# Patient Record
Sex: Female | Born: 1970 | Race: Black or African American | Hispanic: No | Marital: Married | Smoking: Never smoker
Health system: Southern US, Community
[De-identification: ages and names within clinical notes are randomized; demographics above are authoritative.]

## PROBLEM LIST (undated history)

## (undated) DIAGNOSIS — K219 Gastro-esophageal reflux disease without esophagitis: Secondary | ICD-10-CM

## (undated) DIAGNOSIS — K625 Hemorrhage of anus and rectum: Secondary | ICD-10-CM

## (undated) DIAGNOSIS — I10 Essential (primary) hypertension: Secondary | ICD-10-CM

## (undated) DIAGNOSIS — D649 Anemia, unspecified: Secondary | ICD-10-CM

## (undated) DIAGNOSIS — Z5189 Encounter for other specified aftercare: Secondary | ICD-10-CM

## (undated) HISTORY — DX: Essential (primary) hypertension: I10

## (undated) HISTORY — DX: Encounter for other specified aftercare: Z51.89

---

## 2005-08-03 ENCOUNTER — Ambulatory Visit (HOSPITAL_COMMUNITY): Admission: RE | Admit: 2005-08-03 | Discharge: 2005-08-03 | Payer: Self-pay | Admitting: General Surgery

## 2012-10-03 ENCOUNTER — Other Ambulatory Visit: Payer: Self-pay | Admitting: Family Medicine

## 2012-10-03 DIAGNOSIS — Z1231 Encounter for screening mammogram for malignant neoplasm of breast: Secondary | ICD-10-CM

## 2012-10-19 ENCOUNTER — Encounter (INDEPENDENT_AMBULATORY_CARE_PROVIDER_SITE_OTHER): Payer: Self-pay | Admitting: *Deleted

## 2012-10-26 ENCOUNTER — Ambulatory Visit: Payer: Self-pay

## 2012-11-10 ENCOUNTER — Ambulatory Visit
Admission: RE | Admit: 2012-11-10 | Discharge: 2012-11-10 | Disposition: A | Discharge status: Home / Self Care | Payer: BC Managed Care – PPO | Source: Ambulatory Visit | Attending: Family Medicine | Admitting: Family Medicine

## 2012-11-10 DIAGNOSIS — Z1231 Encounter for screening mammogram for malignant neoplasm of breast: Secondary | ICD-10-CM

## 2012-11-20 ENCOUNTER — Telehealth (INDEPENDENT_AMBULATORY_CARE_PROVIDER_SITE_OTHER): Payer: Self-pay | Admitting: *Deleted

## 2012-11-20 ENCOUNTER — Other Ambulatory Visit (INDEPENDENT_AMBULATORY_CARE_PROVIDER_SITE_OTHER): Payer: Self-pay | Admitting: *Deleted

## 2012-11-20 ENCOUNTER — Ambulatory Visit (INDEPENDENT_AMBULATORY_CARE_PROVIDER_SITE_OTHER): Payer: BC Managed Care – PPO | Admitting: Internal Medicine

## 2012-11-20 ENCOUNTER — Encounter (INDEPENDENT_AMBULATORY_CARE_PROVIDER_SITE_OTHER): Payer: Self-pay | Admitting: Internal Medicine

## 2012-11-20 VITALS — BP 130/80 | HR 80 | Temp 98.3°F | Ht 60.0 in | Wt 135.4 lb

## 2012-11-20 DIAGNOSIS — I10 Essential (primary) hypertension: Secondary | ICD-10-CM | POA: Insufficient documentation

## 2012-11-20 DIAGNOSIS — Z1211 Encounter for screening for malignant neoplasm of colon: Secondary | ICD-10-CM

## 2012-11-20 DIAGNOSIS — K625 Hemorrhage of anus and rectum: Secondary | ICD-10-CM

## 2012-11-20 MED ORDER — PEG 3350-KCL-NA BICARB-NACL 420 G PO SOLR: 4000.0000 mL | Freq: Once | ORAL | Status: DC

## 2012-11-20 NOTE — Telephone Encounter (Signed)
Patient needs trilyte instructions 

## 2012-11-20 NOTE — Progress Notes (Signed)
Subjective:     Patient ID: Christine Kidd, female   DOB: March 14, 1970, 42 y.o.   MRN: [id number]  HPIReferred to our office by Dr. Theodis Shove for blood in stools and frequent diarrhea. Fecal occult blood in office was negative. She says she occasionally sees blood. She has been seeing blood off and on for about 2 yrs. Sometimes the blood is bright red and sometimes it is stringy dark. It occurs about every 2 days and usually last to about 1 week to 10 days. No pain associated with the rectal bleeding. She is not straining to have a BM. Stools are formed and she says they are smaller and more frequent. She is having 2-3 stools a day. She was having one stool a day. Appetite is not good. Her appetite has decreased. She thinks she may have lost about 4-5 pounds over the past 3 months.  Sometimes she will have soreness in her epigastric region and rt lower quadrant. It feels like she has done sit ups when they occur. She has frequent burping. No acid reflux.  NoNSAIDs except for the daily ASA 81mg .    09/23/2012 H and H 13.9 and 41.2   Review of Systems  See hpi Current Outpatient Prescriptions  Medication Sig Dispense Refill  . aspirin 81 MG tablet Take 81 mg by mouth daily.      Marland Kitchen triamterene-hydrochlorothiazide (DYAZIDE) 37.5-25 MG per capsule Take 1 capsule by mouth daily.       No current facility-administered medications for this visit.   Past Medical History  Diagnosis Date  . Hypertension     x 14 yrs   History reviewed. No pertinent past surgical history. No Known Allergies      Objective:   Physical Exam  Filed Vitals:   11/20/12 1513  BP: 130/80  Pulse: 80  Temp: 98.3 F (36.8 C)  Height: 5' (1.524 m)  Weight: 135 lb 6.4 oz (61.417 kg)   Alert and oriented. Skin warm and dry. Oral mucosa is moist.   . Sclera anicteric, conjunctivae is pink. Thyroid not enlarged. No cervical lymphadenopathy. Lungs clear. Heart regular rate and rhythm.  Abdomen is soft. Bowel  sounds are positive. No hepatomegaly. No abdominal masses felt. No tenderness.  No edema to lower extremities. Patient is alert and oriented.     Assessment:    Rectal bleeding. Colonic neoplasm needs to be ruled. Diverticular bleed, polyp and hemorroids are in the differential.     Plan:    Colonoscopy with Dr. Karilyn Cota.The risks and benefits such as perforation, bleeding, and infection were reviewed with the patient and is agreeable.

## 2012-11-20 NOTE — Patient Instructions (Signed)
The risks and benefits such as perforation, bleeding, and infection were reviewed with the patient and is agreeable. 

## 2012-11-29 ENCOUNTER — Encounter (HOSPITAL_COMMUNITY): Payer: Self-pay | Admitting: Pharmacy Technician

## 2012-11-30 ENCOUNTER — Encounter (HOSPITAL_COMMUNITY): Payer: Self-pay | Admitting: *Deleted

## 2012-11-30 ENCOUNTER — Ambulatory Visit (HOSPITAL_COMMUNITY)
Admission: RE | Admit: 2012-11-30 | Discharge: 2012-11-30 | Disposition: A | Discharge status: Home / Self Care | Payer: BC Managed Care – PPO | Source: Ambulatory Visit | Attending: Internal Medicine | Admitting: Internal Medicine

## 2012-11-30 ENCOUNTER — Encounter (HOSPITAL_COMMUNITY)
Admission: RE | Disposition: A | Discharge status: Home / Self Care | Payer: Self-pay | Source: Ambulatory Visit | Attending: Internal Medicine

## 2012-11-30 DIAGNOSIS — D179 Benign lipomatous neoplasm, unspecified: Secondary | ICD-10-CM | POA: Insufficient documentation

## 2012-11-30 DIAGNOSIS — K5730 Diverticulosis of large intestine without perforation or abscess without bleeding: Secondary | ICD-10-CM

## 2012-11-30 DIAGNOSIS — K644 Residual hemorrhoidal skin tags: Secondary | ICD-10-CM | POA: Insufficient documentation

## 2012-11-30 DIAGNOSIS — K648 Other hemorrhoids: Secondary | ICD-10-CM

## 2012-11-30 DIAGNOSIS — R197 Diarrhea, unspecified: Secondary | ICD-10-CM | POA: Insufficient documentation

## 2012-11-30 DIAGNOSIS — I10 Essential (primary) hypertension: Secondary | ICD-10-CM | POA: Insufficient documentation

## 2012-11-30 DIAGNOSIS — K626 Ulcer of anus and rectum: Secondary | ICD-10-CM

## 2012-11-30 DIAGNOSIS — K625 Hemorrhage of anus and rectum: Secondary | ICD-10-CM | POA: Insufficient documentation

## 2012-11-30 HISTORY — DX: Hemorrhage of anus and rectum: K62.5

## 2012-11-30 HISTORY — PX: COLONOSCOPY: SHX5424

## 2012-11-30 SURGERY — COLONOSCOPY
Anesthesia: Moderate Sedation

## 2012-11-30 MED ORDER — SODIUM CHLORIDE 0.9 % IV SOLN
INTRAVENOUS | Status: DC
  Administered 2012-11-30: 08:00:00 via INTRAVENOUS

## 2012-11-30 MED ORDER — MEPERIDINE HCL 50 MG/ML IJ SOLN
INTRAMUSCULAR | Status: DC | PRN
  Administered 2012-11-30 (×2): 25 mg via INTRAVENOUS

## 2012-11-30 MED ORDER — MIDAZOLAM HCL 5 MG/5ML IJ SOLN
INTRAMUSCULAR | Status: AC
  Filled 2012-11-30: qty 10

## 2012-11-30 MED ORDER — DICYCLOMINE HCL 10 MG PO CAPS: 10.0000 mg | ORAL_CAPSULE | Freq: Two times a day (BID) | ORAL | Status: DC

## 2012-11-30 MED ORDER — MIDAZOLAM HCL 5 MG/5ML IJ SOLN
INTRAMUSCULAR | Status: DC | PRN
  Administered 2012-11-30 (×6): 2 mg via INTRAVENOUS

## 2012-11-30 MED ORDER — MEPERIDINE HCL 50 MG/ML IJ SOLN
INTRAMUSCULAR | Status: AC
  Filled 2012-11-30: qty 1

## 2012-11-30 MED ORDER — MIDAZOLAM HCL 5 MG/5ML IJ SOLN
INTRAMUSCULAR | Status: AC
  Filled 2012-11-30: qty 5

## 2012-11-30 MED ORDER — HYDROCORTISONE ACETATE 25 MG RE SUPP: 25.0000 mg | Freq: Every day | RECTAL | Status: DC

## 2012-11-30 NOTE — Op Note (Addendum)
COLONOSCOPY PROCEDURE REPORT  PATIENT:  Christine Kidd  MR#:  [id number] Birthdate:  [birth year], 42 y.o., female Endoscopist:  Dr. Malissa Hippo, MD Referred By:  Dr. Trinna Post, M.D.  Procedure Date: 11/30/2012  Procedure:   Colonoscopy  Indications:  Patient is 41 year old African female who presents with two-year history of intermittent diarrhea and rectal bleeding.  Informed Consent:  The procedure and risks were reviewed with the patient and informed consent was obtained.  Medications:  Demerol 50 mg IV Versed 12 mg IV  Description of procedure:  After a digital rectal exam was performed, that colonoscope was advanced from the anus through the rectum and colon to the area of the cecum, ileocecal valve and appendiceal orifice. The cecum was deeply intubated. These structures were well-seen and photographed for the record. From the level of the cecum and ileocecal valve, the scope was slowly and cautiously withdrawn. The mucosal surfaces were carefully surveyed utilizing scope tip to flexion to facilitate fold flattening as needed. The scope was pulled down into the rectum where a thorough exam including retroflexion was performed.  Findings:   Prep excellent. 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 Single small diverticulum at hepatic flexure. Redundant and tortuous sigmoid colon. 1 cm diameter distal rectal ulcer just above the dentate line. Small hemorrhoids above the dentate line and prominent hemorrhoids below the dentate line.   Therapeutic/Diagnostic Maneuvers Performed:  Biopsy taken from distal rectal ulcer.  Complications:  None  Cecal Withdrawal Time:  15 minutes  Impression:  Examination performed to cecum. Single small submucosal lipoma ascending colon. Single small diverticulum at hepatic flexure. Distal rectal ulcer proximal to dentate line. I see taken for routine histology. Internal/external hemorrhoids.  Suspect she has diarrhea  secondary to IBS and rectal bleeding secondary to distal rectal ulcer which endoscopically appears to be benign.  Recommendations:  Standard instructions given. Metamucil 4 g by mouth each bedtime. Dicyclomine 10 mg by mouth twice a day. Anusol-HC suppository 1 per rectum daily at bedtime for 2 weeks. I will contact patient with biopsy results and further recommendations.  REHMAN,NAJEEB U  11/30/2012 9:20 AM  CC: Dr. Hassan Rowan, Su Monks, MD & Dr. Bonnetta Barry ref. provider found

## 2012-11-30 NOTE — H&P (Signed)
Christine Kidd is an 42 y.o. female.   Chief Complaint: Patient is here for colonoscopy. HPI: Patient is a 42 year old African female who presents with 2 years history of diarrhea and rectal bleeding. Diarrhea is intermittent and may occur at least 10 days each month. She generally has 3-6 stools when she has diarrhea. The rest of the days she is normal in occasion she may be constipated and takes a laxative. She denies nocturnal bowel movements. She complains of intermittent stabbing pain in right lower quadrant. Lately she's not had a good appetite and has lost 7 pounds. She passes a small to moderate amount of bright red or dark blood intermittently but not daily. Her H&H on 09/23/2012 was 13.9 and 41.2. Family history is negative for CRC for inflammatory bowel disease.  Past Medical History  Diagnosis Date  . Hypertension     x 14 yrs  . Rectal bleeding     Past Surgical History  Procedure Laterality Date  . No past surgeries      Family History  Problem Relation Age of Onset  . Colon cancer Neg Hx    Social History:  reports that she has never smoked. She does not have any smokeless tobacco history on file. She reports that she does not drink alcohol or use illicit drugs.  Allergies: No Known Allergies  Medications Prior to Admission  Medication Sig Dispense Refill  . aspirin 81 MG tablet Take 81 mg by mouth daily.      Marland Kitchen triamterene-hydrochlorothiazide (DYAZIDE) 37.5-25 MG per capsule Take 1 capsule by mouth daily.        No results found for this or any previous visit (from the past 48 hour(s)). No results found.  ROS  Blood pressure 134/92, pulse 100, temperature 98.3 F (36.8 C), temperature source Oral, resp. rate 22, height 5\' 3"  (1.6 m), weight 130 lb (58.968 kg), last menstrual period 11/13/2012, SpO2 100.00%. Physical Exam  Constitutional: She appears well-developed and well-nourished.  HENT:  Mouth/Throat: Oropharynx is clear and moist.  Eyes:  Conjunctivae are normal. No scleral icterus.  Neck: No thyromegaly present.  Cardiovascular: Normal rate, regular rhythm and normal heart sounds.   No murmur heard. Respiratory: Effort normal and breath sounds normal.  GI: Soft. She exhibits no distension and no mass. There is no tenderness.  Musculoskeletal: She exhibits no edema.  Lymphadenopathy:    She has no cervical adenopathy.  Neurological: She is alert.  Skin: Skin is warm and dry.     Assessment/Plan Recurrent diarrhea and hematochezia. Diagnostic colonoscopy.  REHMAN,NAJEEB U 11/30/2012, 8:26 AM

## 2012-12-06 ENCOUNTER — Encounter (HOSPITAL_COMMUNITY): Payer: Self-pay | Admitting: Internal Medicine

## 2012-12-06 ENCOUNTER — Encounter (INDEPENDENT_AMBULATORY_CARE_PROVIDER_SITE_OTHER): Payer: Self-pay | Admitting: *Deleted

## 2012-12-06 NOTE — Progress Notes (Signed)
Apt has been scheduled for 01/17/12 with Dorene Ar, NP.

## 2012-12-21 ENCOUNTER — Encounter (INDEPENDENT_AMBULATORY_CARE_PROVIDER_SITE_OTHER): Payer: Self-pay

## 2013-01-16 ENCOUNTER — Ambulatory Visit (INDEPENDENT_AMBULATORY_CARE_PROVIDER_SITE_OTHER): Payer: BC Managed Care – PPO | Admitting: Internal Medicine

## 2013-01-16 ENCOUNTER — Encounter (INDEPENDENT_AMBULATORY_CARE_PROVIDER_SITE_OTHER): Payer: Self-pay | Admitting: Internal Medicine

## 2013-01-16 VITALS — BP 126/60 | HR 70 | Temp 99.0°F | Ht 63.0 in | Wt 135.6 lb

## 2013-01-16 DIAGNOSIS — K219 Gastro-esophageal reflux disease without esophagitis: Secondary | ICD-10-CM

## 2013-01-16 MED ORDER — OMEPRAZOLE 40 MG PO CPDR: 40.0000 mg | DELAYED_RELEASE_CAPSULE | Freq: Every day | ORAL | Status: DC

## 2013-01-16 NOTE — Patient Instructions (Signed)
Will try her on Omeprazole 40mg  daily 30 minutes before breakfast and see how she does. OV in 6 weeks.

## 2013-01-16 NOTE — Progress Notes (Signed)
Subjective:     Patient ID: Christine Kidd, female   DOB: [birth year], 43 y.o.   MRN: [id number]  HPI Here today for f/u after recently undergoing a colonoscopy in November for rectal bleeding and diarrhea. Please seen below.  She tells me she is doing better. The bleeding is better. She occasionally see  Blood when she wipes. No melena.  She is having a BM x 2 a day and are formed.  Today, she c/o that she is having problem with dinner. She will eat about 1/2 her meal and have to stop. There has been no weight loss.  She does c/o burning in her throat. She is not taking a PPI.  Burning occurs if she has eaten or not eaten.  No NSAIDs except the ASA 81mg .   11/30/2012 Colonoscopy: Rectal bleeding and diarrhea:  Impression:  Examination performed to cecum.  Single small submucosal lipoma ascending colon.  Single small diverticulum at hepatic flexure.  Distal rectal ulcer proximal to dentate line. I see taken for routine histology.  Internal/external hemorrhoids. Biopsy:  Biopsy reveals ulcerated mucosa at the squamocolumnar junction. Suspect these changes are secondary to hemorrhoids.  Standard instructions given.  Metamucil 4 g by mouth each bedtime.  Dicyclomine 10 mg by mouth twice a day.  Anusol-HC suppository 1 per rectum daily at bedtime for 2 weeks.  I will contact patient with biopsy results and further recommendations.     Review of Systems Current Outpatient Prescriptions  Medication Sig Dispense Refill  . aspirin 81 MG tablet Take 81 mg by mouth daily.      Marland Kitchen dicyclomine (BENTYL) 10 MG capsule Take 1 capsule (10 mg total) by mouth 2 (two) times daily before a meal.  60 capsule  5  . triamterene-hydrochlorothiazide (DYAZIDE) 37.5-25 MG per capsule Take 1 capsule by mouth daily.      . Wheat Dextrin (BENEFIBER DRINK MIX PO) Take by mouth.       No current facility-administered medications for this visit.   Past Medical History  Diagnosis Date  . Hypertension     x  14 yrs  . Rectal bleeding    Past Surgical History  Procedure Laterality Date  . No past surgeries    . Colonoscopy N/A 11/30/2012    Procedure: COLONOSCOPY;  Surgeon: Rogene Houston, MD;  Location: [facility];  Service: Endoscopy;  Laterality: N/A;  830   No Known Allergies      Objective:   Physical Exam  Filed Vitals:   01/16/13 1542  BP: 126/60  Pulse: 70  Temp: 99 F (37.2 C)  Height: 5\' 3"  (1.6 m)  Weight: 135 lb 9.6 oz (61.508 kg)   Alert and oriented. Skin warm and dry. Oral mucosa is moist.   . Sclera anicteric, conjunctivae is pink. Thyroid not enlarged. No cervical lymphadenopathy. Lungs clear. Heart regular rate and rhythm.  Abdomen is soft. Bowel sounds are positive. No hepatomegaly. No abdominal masses felt. No tenderness.  No edema to lower extremities.       Assessment:   GERD. Not on a PPI at this time. Diarrhea has resolved now with the Metamucil.     Plan:    Start Omeprazole 40mg  30 minutes before breakfast. OV in 6 weeks. Continue the Metamucil

## 2013-02-27 ENCOUNTER — Ambulatory Visit (INDEPENDENT_AMBULATORY_CARE_PROVIDER_SITE_OTHER): Payer: BC Managed Care – PPO | Admitting: Internal Medicine

## 2013-04-03 ENCOUNTER — Ambulatory Visit (INDEPENDENT_AMBULATORY_CARE_PROVIDER_SITE_OTHER): Payer: BC Managed Care – PPO | Admitting: Internal Medicine

## 2013-04-03 ENCOUNTER — Encounter (INDEPENDENT_AMBULATORY_CARE_PROVIDER_SITE_OTHER): Payer: Self-pay | Admitting: Internal Medicine

## 2013-04-03 VITALS — BP 112/66 | HR 84 | Ht 63.0 in | Wt 136.1 lb

## 2013-04-03 DIAGNOSIS — K589 Irritable bowel syndrome without diarrhea: Secondary | ICD-10-CM | POA: Insufficient documentation

## 2013-04-03 DIAGNOSIS — K6289 Other specified diseases of anus and rectum: Secondary | ICD-10-CM

## 2013-04-03 DIAGNOSIS — K219 Gastro-esophageal reflux disease without esophagitis: Secondary | ICD-10-CM

## 2013-04-03 MED ORDER — HYDROCORTISONE ACETATE 25 MG RE SUPP: 25.0000 mg | Freq: Two times a day (BID) | RECTAL | Status: DC

## 2013-04-03 MED ORDER — DICYCLOMINE HCL 10 MG PO CAPS: 10.0000 mg | ORAL_CAPSULE | Freq: Three times a day (TID) | ORAL | Status: DC

## 2013-04-03 NOTE — Progress Notes (Signed)
Subjective:     Patient ID: Christine Kidd, female   DOB: 29-Sep-1970, 43 y.o.   MRN: [id number]  HPI Here today for f/u. She underwent a colonoscopy in November for rectal bleeding and diarrhea. Please see below.  She tells me she has burning in her esophagus. Her insurance cut her Omeprazole to 20mg  instead of 40mg .  She still has urge to go to the bathroom, but then does not have a BM . She has a BM usually 4 a day. He stools are formed.  No melena or bright red rectal bleeding No NSAIDs except for the ASA 81mg . Appetite is okay. No weight loss. Occasionally has epigastric tenderness and tenderness at umbilicus.  She is on a Probiotic ([place]). She also eats Activa yoguart     11/30/2012 Colonoscopy: Rectal bleeding and diarrhea:  Impression:  Examination performed to cecum.  Single small submucosal lipoma ascending colon.  Single small diverticulum at hepatic flexure.  Distal rectal ulcer proximal to dentate line. I see taken for routine histology.  Internal/external hemorrhoids.  Biopsy:  Biopsy reveals ulcerated mucosa at the squamocolumnar junction. Suspect these changes are secondary to hemorrhoids.  Standard instructions given.  Metamucil 4 g by mouth each bedtime.  Dicyclomine 10 mg by mouth twice a day.  Anusol-HC suppository 1 per rectum daily at bedtime for 2 weeks.  I will contact patient with biopsy results and further recommendations.   Review of Systems Past Medical History  Diagnosis Date  . Hypertension     x 14 yrs  . Rectal bleeding     Past Surgical History  Procedure Laterality Date  . No past surgeries    . Colonoscopy N/A 11/30/2012    Procedure: COLONOSCOPY;  Surgeon: Rogene Houston, MD;  Location: [facility];  Service: Endoscopy;  Laterality: N/A;  830    No Known Allergies  Current Outpatient Prescriptions on File Prior to Visit  Medication Sig Dispense Refill  . aspirin 81 MG tablet Take 81 mg by mouth daily.      Marland Kitchen  dicyclomine (BENTYL) 10 MG capsule Take 1 capsule (10 mg total) by mouth 2 (two) times daily before a meal.  60 capsule  5  . omeprazole (PRILOSEC) 40 MG capsule Take 1 capsule (40 mg total) by mouth daily.  90 capsule  3  . triamterene-hydrochlorothiazide (DYAZIDE) 37.5-25 MG per capsule Take 1 capsule by mouth daily.      . Wheat Dextrin (BENEFIBER DRINK MIX PO) Take by mouth.       No current facility-administered medications on file prior to visit.   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 Two children in good health.      Objective:   Physical Exam  Filed Vitals:   04/03/13 1501  BP: 112/66  Pulse: 84  Height: 5\' 3"  (1.6 m)  Weight: 136 lb 1.6 oz (61.735 kg)   Alert and oriented. Skin warm and dry. Oral mucosa is moist.   . Sclera anicteric, conjunctivae is pink. Thyroid not enlarged. No cervical lymphadenopathy. Lungs clear. Heart regular rate and rhythm.  Abdomen is soft. Bowel sounds are positive. No hepatomegaly. No abdominal masses felt. Slight lower abdominal  tenderness.         Assessment:    GERD, Presently taking Omeprazole.  Rectal pain probably from Hemorrhoids. Her stools are formed now.  IBS: Presently taking Dicyclomine 10mg  and will increase to TID.    Plan:     Dicyclomine 10mg  TID, Anusol supp BID x 6 days  Continue the Omeprazole. OV in 1 yr. If any problems, call our office

## 2013-04-03 NOTE — Patient Instructions (Signed)
OV 1 yr. Dicyclomine 10mg  TID, Anusol Supp BID. Prilosec 40mg  daily.

## 2013-04-03 NOTE — Progress Notes (Deleted)
Subjective:     Patient ID: Christine Kidd, female   DOB: 1970/10/03, 43 y.o.   MRN: [id number]  HPI   Review of Systems     Objective:   Physical Exam     Assessment:     ***    Plan:     ***

## 2013-07-30 ENCOUNTER — Other Ambulatory Visit (HOSPITAL_COMMUNITY): Payer: Self-pay | Admitting: Internal Medicine

## 2013-07-31 ENCOUNTER — Other Ambulatory Visit (INDEPENDENT_AMBULATORY_CARE_PROVIDER_SITE_OTHER): Payer: Self-pay | Admitting: Internal Medicine

## 2013-07-31 ENCOUNTER — Telehealth (INDEPENDENT_AMBULATORY_CARE_PROVIDER_SITE_OTHER): Payer: Self-pay | Admitting: Internal Medicine

## 2013-07-31 DIAGNOSIS — K589 Irritable bowel syndrome without diarrhea: Secondary | ICD-10-CM

## 2013-07-31 MED ORDER — DICYCLOMINE HCL 10 MG PO CAPS: 10.0000 mg | ORAL_CAPSULE | Freq: Three times a day (TID) | ORAL | Status: DC

## 2013-07-31 NOTE — Telephone Encounter (Signed)
Dicyclomine 10mg  twice a day.

## 2013-10-08 ENCOUNTER — Encounter (INDEPENDENT_AMBULATORY_CARE_PROVIDER_SITE_OTHER): Payer: Self-pay | Admitting: Internal Medicine

## 2013-10-08 ENCOUNTER — Ambulatory Visit (INDEPENDENT_AMBULATORY_CARE_PROVIDER_SITE_OTHER): Payer: BC Managed Care – PPO | Admitting: Internal Medicine

## 2013-10-08 VITALS — BP 118/74 | HR 80 | Temp 98.3°F | Ht 63.0 in | Wt 132.0 lb

## 2013-10-08 DIAGNOSIS — A084 Viral intestinal infection, unspecified: Secondary | ICD-10-CM

## 2013-10-08 DIAGNOSIS — K529 Noninfective gastroenteritis and colitis, unspecified: Secondary | ICD-10-CM

## 2013-10-08 DIAGNOSIS — A088 Other specified intestinal infections: Secondary | ICD-10-CM

## 2013-10-08 DIAGNOSIS — K5289 Other specified noninfective gastroenteritis and colitis: Secondary | ICD-10-CM

## 2013-10-08 NOTE — Patient Instructions (Signed)
CBC, and CMET today.  Force fluids.

## 2013-10-08 NOTE — Progress Notes (Signed)
   Subjective:    Patient ID: Christine Kidd, female    DOB: 1970-02-01, 43 y.o.   MRN: [id number]  HPI Here today with c/o of diarrhea since Friday. She says she has no appetite since Friday. Started Friday morning. On Friday she had chills and had a fever. She had 8 loose stools Friday. Saturday she had 5 stools and Sunday she had 4 stools. Today she has had 3 stools and are watery. She has tried Imodium. Her fever broke last night. She has not had an appetite. She has nausea. She says she laid around all weekend. Today she feels better. She feels 40% better. No recent antibiotics. Normally she has 2-3 stools and are formed with the Dicyclomine.     11 /20/2014 Colonoscopy:  Impression:  Examination performed to cecum.  Single small submucosal lipoma ascending colon.  Single small diverticulum at hepatic flexure.  Distal rectal ulcer proximal to dentate line. I see taken for routine histology.  Internal/external hemorrhoids.  Suspect she has diarrhea secondary to IBS and rectal bleeding secondary to distal rectal ulcer which endoscopically appears to be benign.       Review of Systems Past Medical History  Diagnosis Date  . Hypertension     x 14 yrs  . Rectal bleeding     Past Surgical History  Procedure Laterality Date  . No past surgeries    . Colonoscopy N/A 11/30/2012    Procedure: COLONOSCOPY;  Surgeon: Rogene Houston, MD;  Location: [facility];  Service: Endoscopy;  Laterality: N/A;  830    No Known Allergies  Current Outpatient Prescriptions on File Prior to Visit  Medication Sig Dispense Refill  . aspirin 81 MG tablet Take 81 mg by mouth daily.      Marland Kitchen dicyclomine (BENTYL) 10 MG capsule Take 1 capsule (10 mg total) by mouth 3 (three) times daily.  90 capsule  4  . omeprazole (PRILOSEC) 40 MG capsule Take 1 capsule (40 mg total) by mouth daily.  90 capsule  3  . triamterene-hydrochlorothiazide (DYAZIDE) 37.5-25 MG per capsule Take 1 capsule by mouth  daily.      . Wheat Dextrin (BENEFIBER DRINK MIX PO) Take by mouth.       No current facility-administered medications on file prior to visit.        Objective:   Physical Exam  Filed Vitals:   10/08/13 1553  BP: 118/74  Pulse: 80  Temp: 98.3 F (36.8 C)  Height: 5\' 3"  (1.6 m)  Weight: 132 lb (59.875 kg)   Alert and oriented. Skin warm and dry. Oral mucosa is moist.   . Sclera anicteric, conjunctivae is pink. Thyroid not enlarged. No cervical lymphadenopathy. Lungs clear. Heart regular rate and rhythm.  Abdomen is soft. Bowel sounds are positive. No hepatomegaly. No abdominal masses felt. No tenderness.  No edema to lower extremities.         Assessment & Plan:  Viral gatroenteritis given her hx.  Stools are gradually slowing down.  Gatorade. Force fluid. Bland diet. CBC, CMET today.

## 2013-10-09 LAB — COMPREHENSIVE METABOLIC PANEL
ALK PHOS: 41 U/L (ref 39–117)
ALT: 26 U/L (ref 0–35)
AST: 27 U/L (ref 0–37)
Albumin: 5 g/dL (ref 3.5–5.2)
BUN: 16 mg/dL (ref 6–23)
CO2: 26 mEq/L (ref 19–32)
Calcium: 10.2 mg/dL (ref 8.4–10.5)
Chloride: 97 mEq/L (ref 96–112)
Creat: 1.05 mg/dL (ref 0.50–1.10)
Glucose, Bld: 77 mg/dL (ref 70–99)
POTASSIUM: 4.5 meq/L (ref 3.5–5.3)
SODIUM: 135 meq/L (ref 135–145)
Total Bilirubin: 0.6 mg/dL (ref 0.2–1.2)
Total Protein: 8 g/dL (ref 6.0–8.3)

## 2013-10-09 LAB — CBC WITH DIFFERENTIAL/PLATELET
BASOS ABS: 0.1 10*3/uL (ref 0.0–0.1)
BASOS PCT: 1 % (ref 0–1)
Eosinophils Absolute: 0.1 10*3/uL (ref 0.0–0.7)
Eosinophils Relative: 1 % (ref 0–5)
HCT: 36 % (ref 36.0–46.0)
Hemoglobin: 11.3 g/dL — ABNORMAL LOW (ref 12.0–15.0)
LYMPHS PCT: 23 % (ref 12–46)
Lymphs Abs: 1.8 10*3/uL (ref 0.7–4.0)
MCH: 23.9 pg — ABNORMAL LOW (ref 26.0–34.0)
MCHC: 31.4 g/dL (ref 30.0–36.0)
MCV: 76.1 fL — ABNORMAL LOW (ref 78.0–100.0)
MONO ABS: 0.6 10*3/uL (ref 0.1–1.0)
Monocytes Relative: 8 % (ref 3–12)
NEUTROS ABS: 5.2 10*3/uL (ref 1.7–7.7)
NEUTROS PCT: 67 % (ref 43–77)
Platelets: 468 10*3/uL — ABNORMAL HIGH (ref 150–400)
RBC: 4.73 MIL/uL (ref 3.87–5.11)
RDW: 15.8 % — AB (ref 11.5–15.5)
WBC: 7.8 10*3/uL (ref 4.0–10.5)

## 2013-10-11 ENCOUNTER — Telehealth (INDEPENDENT_AMBULATORY_CARE_PROVIDER_SITE_OTHER): Payer: Self-pay | Admitting: Internal Medicine

## 2013-10-11 DIAGNOSIS — D509 Iron deficiency anemia, unspecified: Secondary | ICD-10-CM

## 2013-10-11 NOTE — Telephone Encounter (Signed)
Will get iron studies. She will call next week and let me know how she is doing.

## 2013-10-19 LAB — VITAMIN B12: Vitamin B-12: 981 pg/mL — ABNORMAL HIGH (ref 211–911)

## 2013-10-19 LAB — IRON AND TIBC
%SAT: 5 % — AB (ref 20–55)
Iron: 20 ug/dL — ABNORMAL LOW (ref 42–145)
TIBC: 390 ug/dL (ref 250–470)
UIBC: 370 ug/dL (ref 125–400)

## 2013-10-19 LAB — FERRITIN: FERRITIN: 1 ng/mL — AB (ref 10–291)

## 2013-10-19 LAB — FOLATE: Folate: 12.8 ng/mL

## 2013-11-23 ENCOUNTER — Other Ambulatory Visit (HOSPITAL_COMMUNITY): Payer: Self-pay | Admitting: Family Medicine

## 2013-11-23 DIAGNOSIS — R1909 Other intra-abdominal and pelvic swelling, mass and lump: Secondary | ICD-10-CM

## 2013-11-26 ENCOUNTER — Ambulatory Visit (HOSPITAL_COMMUNITY)
Admission: RE | Admit: 2013-11-26 | Discharge: 2013-11-26 | Disposition: A | Discharge status: Home / Self Care | Payer: BC Managed Care – PPO | Source: Ambulatory Visit | Attending: Family Medicine | Admitting: Family Medicine

## 2013-11-26 ENCOUNTER — Other Ambulatory Visit (HOSPITAL_COMMUNITY): Payer: Self-pay | Admitting: Family Medicine

## 2013-11-26 DIAGNOSIS — N852 Hypertrophy of uterus: Secondary | ICD-10-CM | POA: Diagnosis not present

## 2013-11-26 DIAGNOSIS — D259 Leiomyoma of uterus, unspecified: Secondary | ICD-10-CM | POA: Insufficient documentation

## 2013-11-26 DIAGNOSIS — R1909 Other intra-abdominal and pelvic swelling, mass and lump: Secondary | ICD-10-CM

## 2013-11-26 DIAGNOSIS — R102 Pelvic and perineal pain: Secondary | ICD-10-CM

## 2013-12-05 ENCOUNTER — Encounter (INDEPENDENT_AMBULATORY_CARE_PROVIDER_SITE_OTHER): Payer: Self-pay | Admitting: Internal Medicine

## 2013-12-05 ENCOUNTER — Ambulatory Visit (INDEPENDENT_AMBULATORY_CARE_PROVIDER_SITE_OTHER): Payer: BC Managed Care – PPO | Admitting: Internal Medicine

## 2013-12-05 VITALS — BP 114/66 | HR 68 | Temp 98.6°F | Ht 63.0 in | Wt 135.1 lb

## 2013-12-05 DIAGNOSIS — K219 Gastro-esophageal reflux disease without esophagitis: Secondary | ICD-10-CM

## 2013-12-05 NOTE — Patient Instructions (Signed)
Continue present medication. OV in one year

## 2013-12-05 NOTE — Progress Notes (Signed)
Subjective:    Patient ID: Christine Kidd, female    DOB: 1970/08/14, 43 y.o.   MRN: [id number]  HPI Here today for f/u. She says she is doing well. Appetite is good. No weight loss.  No abdominal pain. Dicyclomine TID. She is having a BM 2-3 times a day and formed. No melena or BRRB. Menstrual periods are moderate and last 4 days. Takes Goody Powder's prn but not on a regular basis.  Seen in September with gastroenteritis.   CMP     Component Value Date/Time   NA 135 10/08/2013 1608   K 4.5 10/08/2013 1608   CL 97 10/08/2013 1608   CO2 26 10/08/2013 1608   GLUCOSE 77 10/08/2013 1608   BUN 16 10/08/2013 1608   CREATININE 1.05 10/08/2013 1608   CALCIUM 10.2 10/08/2013 1608   PROT 8.0 10/08/2013 1608   ALBUMIN 5.0 10/08/2013 1608   AST 27 10/08/2013 1608   ALT 26 10/08/2013 1608   ALKPHOS 41 10/08/2013 1608   BILITOT 0.6 10/08/2013 1608   CBC    Component Value Date/Time   WBC 7.8 10/08/2013 1608   RBC 4.73 10/08/2013 1608   HGB 11.3* 10/08/2013 1608   HCT 36.0 10/08/2013 1608   PLT 468* 10/08/2013 1608   MCV 76.1* 10/08/2013 1608   MCH 23.9* 10/08/2013 1608   MCHC 31.4 10/08/2013 1608   RDW 15.8* 10/08/2013 1608   LYMPHSABS 1.8 10/08/2013 1608   MONOABS 0.6 10/08/2013 1608   EOSABS 0.1 10/08/2013 1608   BASOSABS 0.1 10/08/2013 1608      11/30/2012 Colonoscopy:  Impression:  Examination performed to cecum.  Single small submucosal lipoma ascending colon.  Single small diverticulum at hepatic flexure.  Distal rectal ulcer proximal to dentate line. I see taken for routine histology.  Internal/external hemorrhoids.  Suspect she has diarrhea secondary to IBS and rectal bleeding secondary to distal rectal ulcer which endoscopically appears to be benign.    Review of Systems Past Medical History  Diagnosis Date  . Hypertension     x 14 yrs  . Rectal bleeding     Past Surgical History  Procedure Laterality Date  . No past surgeries    .  Colonoscopy N/A 11/30/2012    Procedure: COLONOSCOPY;  Surgeon: Rogene Houston, MD;  Location: [facility];  Service: Endoscopy;  Laterality: N/A;  830    No Known Allergies  Current Outpatient Prescriptions on File Prior to Visit  Medication Sig Dispense Refill  . aspirin 81 MG tablet Take 81 mg by mouth daily.    Marland Kitchen dicyclomine (BENTYL) 10 MG capsule Take 1 capsule (10 mg total) by mouth 3 (three) times daily. 90 capsule 4  . omeprazole (PRILOSEC) 40 MG capsule Take 1 capsule (40 mg total) by mouth daily. 90 capsule 3  . Probiotic Product ([place]) CAPS Take by mouth.    . triamterene-hydrochlorothiazide (DYAZIDE) 37.5-25 MG per capsule Take 1 capsule by mouth daily.    . Wheat Dextrin (BENEFIBER DRINK MIX PO) Take by mouth.     No current facility-administered medications on file prior to visit.    Past Medical History  Diagnosis Date  . Hypertension     x 14 yrs  . Rectal bleeding     Past Surgical History  Procedure Laterality Date  . No past surgeries    . Colonoscopy N/A 11/30/2012    Procedure: COLONOSCOPY;  Surgeon: Rogene Houston, MD;  Location: [facility];  Service: Endoscopy;  Laterality: N/A;  830    No Known Allergies  Current Outpatient Prescriptions on File Prior to Visit  Medication Sig Dispense Refill  . aspirin 81 MG tablet Take 81 mg by mouth daily.    Marland Kitchen dicyclomine (BENTYL) 10 MG capsule Take 1 capsule (10 mg total) by mouth 3 (three) times daily. 90 capsule 4  . omeprazole (PRILOSEC) 40 MG capsule Take 1 capsule (40 mg total) by mouth daily. 90 capsule 3  . Probiotic Product ([place]) CAPS Take by mouth.    . triamterene-hydrochlorothiazide (DYAZIDE) 37.5-25 MG per capsule Take 1 capsule by mouth daily.    . Wheat Dextrin (BENEFIBER DRINK MIX PO) Take by mouth.     No current facility-administered medications on file prior to visit.        Objective:   Physical Exam  Filed Vitals:   12/05/13 1042  Height: 5'  3" (1.6 m)  Weight: 135 lb 1.6 oz (61.281 kg)   Alert and oriented. Skin warm and dry. Oral mucosa is moist.   . Sclera anicteric, conjunctivae is pink. Thyroid not enlarged. No cervical lymphadenopathy. Lungs clear. Heart regular rate and rhythm.  Abdomen is soft. Bowel sounds are positive. No hepatomegaly. No abdominal masses felt. No tenderness.  No edema to lower extremities.        Assessment & Plan:  GERD controlled at this time. OV in 1 yr.  Happy Thanksgiving.

## 2014-01-10 ENCOUNTER — Other Ambulatory Visit (INDEPENDENT_AMBULATORY_CARE_PROVIDER_SITE_OTHER): Payer: Self-pay | Admitting: Internal Medicine

## 2014-07-24 ENCOUNTER — Other Ambulatory Visit (INDEPENDENT_AMBULATORY_CARE_PROVIDER_SITE_OTHER): Payer: Self-pay | Admitting: Internal Medicine

## 2014-08-14 ENCOUNTER — Encounter (INDEPENDENT_AMBULATORY_CARE_PROVIDER_SITE_OTHER): Payer: Self-pay | Admitting: *Deleted

## 2014-11-16 ENCOUNTER — Other Ambulatory Visit: Payer: Self-pay

## 2014-11-16 DIAGNOSIS — Z1231 Encounter for screening mammogram for malignant neoplasm of breast: Secondary | ICD-10-CM

## 2014-12-09 ENCOUNTER — Ambulatory Visit (INDEPENDENT_AMBULATORY_CARE_PROVIDER_SITE_OTHER): Payer: BC Managed Care – PPO | Admitting: Internal Medicine

## 2014-12-09 ENCOUNTER — Encounter (INDEPENDENT_AMBULATORY_CARE_PROVIDER_SITE_OTHER): Payer: Self-pay | Admitting: Internal Medicine

## 2014-12-09 VITALS — BP 100/62 | HR 80 | Temp 98.3°F | Ht 63.0 in | Wt 139.6 lb

## 2014-12-09 DIAGNOSIS — D508 Other iron deficiency anemias: Secondary | ICD-10-CM

## 2014-12-09 DIAGNOSIS — R1900 Intra-abdominal and pelvic swelling, mass and lump, unspecified site: Secondary | ICD-10-CM | POA: Diagnosis not present

## 2014-12-09 LAB — CBC WITH DIFFERENTIAL/PLATELET
Basophils Absolute: 0 10*3/uL (ref 0.0–0.1)
Basophils Relative: 0 % (ref 0–1)
EOS ABS: 0.1 10*3/uL (ref 0.0–0.7)
Eosinophils Relative: 1 % (ref 0–5)
HCT: 41.9 % (ref 36.0–46.0)
HEMOGLOBIN: 14 g/dL (ref 12.0–15.0)
LYMPHS ABS: 0.4 10*3/uL — AB (ref 0.7–4.0)
Lymphocytes Relative: 5 % — ABNORMAL LOW (ref 12–46)
MCH: 30.2 pg (ref 26.0–34.0)
MCHC: 33.4 g/dL (ref 30.0–36.0)
MCV: 90.5 fL (ref 78.0–100.0)
MONO ABS: 0.3 10*3/uL (ref 0.1–1.0)
MONOS PCT: 4 % (ref 3–12)
MPV: 10.3 fL (ref 8.6–12.4)
NEUTROS PCT: 90 % — AB (ref 43–77)
Neutro Abs: 7.7 10*3/uL (ref 1.7–7.7)
Platelets: 243 10*3/uL (ref 150–400)
RBC: 4.63 MIL/uL (ref 3.87–5.11)
RDW: 12.8 % (ref 11.5–15.5)
WBC: 8.6 10*3/uL (ref 4.0–10.5)

## 2014-12-09 NOTE — Patient Instructions (Signed)
Ct abdomen/pelvis with CM. CBC OV in 1 year

## 2014-12-09 NOTE — Progress Notes (Addendum)
Subjective:    Patient ID: Christine Kidd, female    DOB: [birth year], 44 y.o.   MRN: [id number]  HPI Here today for f/u. She tells me she has had diarrhea this am. She has had 4 stools this am.  She has not taken any medication for the diarrhea. She has taken her Dicyclomine. Hx of IBAS No fever. Her acid reflux is controlled with Omeprazole 40mg  daily. Her appetite is good. She has gained about .7.5 pounds since her visit in September. She occasionally has abdominal cramps an takes Dicyclomine for this.  No melena or BRRB.  Her last period was 3 weeks ago and was light. Husband has had a vasectomy,.   CBC Latest Ref Rng 10/08/2013  WBC 4.0 - 10.5 K/uL 7.8  Hemoglobin 12.0 - 15.0 g/dL 11.3(L)  Hematocrit 36.0 - 46.0 % 36.0  Platelets 150 - 400 K/uL 468(H)      11/30/2012 Colonoscopy:  Impression:  Examination performed to cecum.  Single small submucosal lipoma ascending colon.  Single small diverticulum at hepatic flexure.  Distal rectal ulcer proximal to dentate line. I see taken for routine histology.  Internal/external hemorrhoids.  Suspect she has diarrhea secondary to IBS and rectal bleeding secondary to distal rectal ulcer which endoscopically appears to be benign.    Review of Systems Past Medical History  Diagnosis Date  . Hypertension     x 14 yrs  . Rectal bleeding     Past Surgical History  Procedure Laterality Date  . No past surgeries    . Colonoscopy N/A 11/30/2012    Procedure: COLONOSCOPY;  Surgeon: Rogene Houston, MD;  Location: [facility];  Service: Endoscopy;  Laterality: N/A;  830    No Known Allergies  Current Outpatient Prescriptions on File Prior to Visit  Medication Sig Dispense Refill  . aspirin 81 MG tablet Take 81 mg by mouth daily.    Marland Kitchen dicyclomine (BENTYL) 10 MG capsule Take 1 capsule (10 mg total) by mouth 3 (three) times daily. 90 capsule 4  . ferrous sulfate 325 (65 FE) MG EC tablet Take 325 mg by mouth 3 (three) times  daily with meals.    Marland Kitchen omeprazole (PRILOSEC) 40 MG capsule Take 1 capsule (40 mg total) by mouth daily. 90 capsule 3  . Probiotic Product ([place]) CAPS Take by mouth.    . triamterene-hydrochlorothiazide (DYAZIDE) 37.5-25 MG per capsule Take 1 capsule by mouth daily.    . Wheat Dextrin (BENEFIBER DRINK MIX PO) Take by mouth.    . dicyclomine (BENTYL) 10 MG capsule TAKE ONE CAPSULE BY MOUTH TWICE A DAY (Patient not taking: Reported on 12/09/2014) 60 capsule 4   No current facility-administered medications on file prior to visit.        Objective:   Physical ExamBlood pressure 100/62, pulse 80, temperature 98.3 F (36.8 C), height 5\' 3"  (1.6 m), weight 139 lb 9.6 oz (63.322 kg). Alert and oriented. Skin warm and dry. Oral mucosa is moist.   . Sclera anicteric, conjunctivae is pink. Thyroid not enlarged. No cervical lymphadenopathy. Lungs clear. Heart regular rate and rhythm.  Abdomen is soft. Bowel sounds are positive. No hepatomegaly. Abdominal masses felt rt lower abdomen.  Slight tenderness.  No edema to lower extremities.          Assessment & Plan:  IBS controlled with Dicyclomine. GERD controlled with Omeprazole. Abdominal Mass? Etiology. Will get an US abdomen complete Anemia: CBC today.  Will need OV in 1 year

## 2014-12-09 NOTE — Addendum Note (Signed)
Addended by: Butch Penny on: 12/09/2014 01:50 PM   Modules accepted: Orders

## 2014-12-11 ENCOUNTER — Ambulatory Visit (HOSPITAL_COMMUNITY)
Admission: RE | Admit: 2014-12-11 | Discharge: 2014-12-11 | Disposition: A | Discharge status: Home / Self Care | Payer: BC Managed Care – PPO | Source: Ambulatory Visit | Attending: Internal Medicine | Admitting: Internal Medicine

## 2014-12-11 DIAGNOSIS — R1909 Other intra-abdominal and pelvic swelling, mass and lump: Secondary | ICD-10-CM | POA: Diagnosis present

## 2014-12-11 DIAGNOSIS — K7689 Other specified diseases of liver: Secondary | ICD-10-CM | POA: Diagnosis not present

## 2014-12-11 DIAGNOSIS — R1900 Intra-abdominal and pelvic swelling, mass and lump, unspecified site: Secondary | ICD-10-CM

## 2014-12-11 DIAGNOSIS — D259 Leiomyoma of uterus, unspecified: Secondary | ICD-10-CM | POA: Insufficient documentation

## 2015-01-01 ENCOUNTER — Other Ambulatory Visit (HOSPITAL_COMMUNITY)
Admission: RE | Admit: 2015-01-01 | Discharge: 2015-01-01 | Disposition: A | Discharge status: Home / Self Care | Payer: BC Managed Care – PPO | Source: Ambulatory Visit | Attending: Obstetrics and Gynecology | Admitting: Obstetrics and Gynecology

## 2015-01-01 ENCOUNTER — Ambulatory Visit (INDEPENDENT_AMBULATORY_CARE_PROVIDER_SITE_OTHER): Payer: BC Managed Care – PPO | Admitting: Obstetrics and Gynecology

## 2015-01-01 ENCOUNTER — Encounter: Payer: Self-pay | Admitting: Obstetrics and Gynecology

## 2015-01-01 VITALS — BP 120/80 | HR 80 | Ht 63.0 in | Wt 140.0 lb

## 2015-01-01 DIAGNOSIS — Z01419 Encounter for gynecological examination (general) (routine) without abnormal findings: Secondary | ICD-10-CM | POA: Insufficient documentation

## 2015-01-01 DIAGNOSIS — Z1151 Encounter for screening for human papillomavirus (HPV): Secondary | ICD-10-CM | POA: Insufficient documentation

## 2015-01-01 DIAGNOSIS — Z124 Encounter for screening for malignant neoplasm of cervix: Secondary | ICD-10-CM

## 2015-01-01 DIAGNOSIS — Z0000 Encounter for general adult medical examination without abnormal findings: Secondary | ICD-10-CM

## 2015-01-01 NOTE — Progress Notes (Signed)
Patient ID: Christine Kidd, female   DOB: 1970/10/30, 44 y.o.   MRN: [id number]  Assessment:  Annual Gyn Exam Uterine fibroids, 12 wk stable Mild anemia stable on Fe   Plan:  1. pap smear done, next pap due in 3 years 2. return annually or prn 3    Annual mammogram advised Subjective:  Christine Kidd is a 44 y.o. female with h/o HTN, uterine fibroids who presents for annual exam. Patient's last menstrual period was 12/20/2014. The patient has no complaints. Pt states that she has a heavier flow since developing fibroids, but it is not excessive. Pt's husband has a vasectomy. She denies abdominal pain, dyspareunia.   The following portions of the patient's history were reviewed and updated as appropriate: allergies, current medications, past family history, past medical history, past social history, past surgical history and problem list. Past Medical History  Diagnosis Date  . Hypertension     x 14 yrs  . Rectal bleeding     Past Surgical History  Procedure Laterality Date  . No past surgeries    . Colonoscopy N/A 11/30/2012    Procedure: COLONOSCOPY;  Surgeon: Rogene Houston, MD;  Location: [facility];  Service: Endoscopy;  Laterality: N/A;  830     Current outpatient prescriptions:  .  aspirin 81 MG tablet, Take 81 mg by mouth daily., Disp: , Rfl:  .  dicyclomine (BENTYL) 10 MG capsule, Take 1 capsule (10 mg total) by mouth 3 (three) times daily., Disp: 90 capsule, Rfl: 4 .  ferrous sulfate 325 (65 FE) MG EC tablet, Take 325 mg by mouth 3 (three) times daily with meals., Disp: , Rfl:  .  omeprazole (PRILOSEC) 40 MG capsule, Take 1 capsule (40 mg total) by mouth daily., Disp: 90 capsule, Rfl: 3 .  Probiotic Product ([place]) CAPS, Take by mouth., Disp: , Rfl:  .  triamterene-hydrochlorothiazide (DYAZIDE) 37.5-25 MG per capsule, Take 1 capsule by mouth daily., Disp: , Rfl:  .  Wheat Dextrin (BENEFIBER DRINK MIX PO), Take by mouth., Disp: , Rfl:   Review  of Systems Constitutional: negative Gastrointestinal: negative Genitourinary: negative  Objective:  BP 120/80 mmHg  Pulse 80  Ht 5\' 3"  (1.6 m)  Wt 140 lb (63.504 kg)  BMI 24.81 kg/m2  LMP 12/20/2014   BMI: Body mass index is 24.81 kg/(m^2).  General Appearance: Alert, appropriate appearance for age. No acute distress HEENT: Grossly normal Neck / Thyroid:  Cardiovascular: RRR; normal S1, S2, no murmur Lungs: CTA bilaterally Back: No CVAT Breast Exam: not done (Mammogram scheduled in 02/2015 and pt is comfortable with self exams) Gastrointestinal: Soft, non-tender, no masses or organomegaly Pelvic Exam: Vulva and vagina appear normal. Bimanual exam reveals normal uterus and adnexa. Vaginal: normal mucosa without prolapse or lesions Cervix: normal appearance Adnexa: normal bimanual exam Uterus: Fibroids present; anteflex; 12 week size; irregular; mobile; mildly tender  Rectovaginal: normal rectal, no masses; guaiac negative  Lymphatic Exam: Non-palpable nodes in neck, clavicular, axillary, or inguinal regions  Skin: no rash or abnormalities Neurologic: Normal gait and speech, no tremor  Psychiatric: Alert and oriented, appropriate affect.  Urinalysis:Not done Hemoccult: negative   Mallory Shirk. MD Pgr [phone] 2:33 PM    By signing my name below, I, Hansel Feinstein, attest that this documentation has been prepared under the direction and in the presence of Jonnie Kind, MD. Electronically Signed: Hansel Feinstein, ED Scribe. 01/01/2015. 2:22 PM.  I personally performed the services described in this documentation,  which was SCRIBED in my presence. The recorded information has been reviewed and considered accurate. It has been edited as necessary during review. Jonnie Kind, MD

## 2015-01-01 NOTE — Progress Notes (Signed)
Patient ID: Christine Kidd, female   DOB: 1970-11-25, 44 y.o.   MRN: [id number] Pt here today for annual pap. Pt has already had annual physical at PCP in October. Pt states that she has been told that she had uterine fibroids as well. Pt states that her periods are getting heavier. Pt states that the bleeding has gotten heavier over the past year and just continues to get worse.

## 2015-01-01 NOTE — Addendum Note (Signed)
Addended by: Farley Ly on: 01/01/2015 02:53 PM   Modules accepted: Orders

## 2015-01-03 LAB — CYTOLOGY - PAP

## 2015-01-27 ENCOUNTER — Ambulatory Visit
Admission: RE | Admit: 2015-01-27 | Discharge: 2015-01-27 | Disposition: A | Discharge status: Home / Self Care | Payer: BC Managed Care – PPO | Source: Ambulatory Visit

## 2015-01-27 DIAGNOSIS — Z1231 Encounter for screening mammogram for malignant neoplasm of breast: Secondary | ICD-10-CM

## 2015-02-12 ENCOUNTER — Other Ambulatory Visit (INDEPENDENT_AMBULATORY_CARE_PROVIDER_SITE_OTHER): Payer: Self-pay | Admitting: Internal Medicine

## 2015-07-09 ENCOUNTER — Other Ambulatory Visit (INDEPENDENT_AMBULATORY_CARE_PROVIDER_SITE_OTHER): Payer: Self-pay | Admitting: Internal Medicine

## 2015-10-23 ENCOUNTER — Other Ambulatory Visit: Payer: Self-pay | Admitting: Obstetrics and Gynecology

## 2015-10-23 ENCOUNTER — Encounter (INDEPENDENT_AMBULATORY_CARE_PROVIDER_SITE_OTHER): Payer: Self-pay | Admitting: Internal Medicine

## 2015-10-23 DIAGNOSIS — Z1231 Encounter for screening mammogram for malignant neoplasm of breast: Secondary | ICD-10-CM

## 2015-12-09 ENCOUNTER — Encounter (INDEPENDENT_AMBULATORY_CARE_PROVIDER_SITE_OTHER): Payer: Self-pay | Admitting: Internal Medicine

## 2015-12-09 ENCOUNTER — Encounter (INDEPENDENT_AMBULATORY_CARE_PROVIDER_SITE_OTHER): Payer: Self-pay

## 2015-12-09 ENCOUNTER — Ambulatory Visit (INDEPENDENT_AMBULATORY_CARE_PROVIDER_SITE_OTHER): Payer: BC Managed Care – PPO | Admitting: Internal Medicine

## 2015-12-09 VITALS — BP 134/79 | HR 64 | Temp 98.6°F | Ht 63.0 in | Wt 139.7 lb

## 2015-12-09 DIAGNOSIS — K580 Irritable bowel syndrome with diarrhea: Secondary | ICD-10-CM | POA: Diagnosis not present

## 2015-12-09 NOTE — Progress Notes (Addendum)
   Subjective:    Patient ID: Christine Kidd, female    DOB: 1970/03/03, 45 y.o.   MRN: [id number]  HPI Here today for follow of her IBS. Takes Dicyclomine TID.   Menstrual periods are moderate and last 4 days. Takes Goody Powder's prn but not on a regular basis. She tells me she is doing good. Her appetite is good. No weight loss.  She had diarrhea yesterday. She says she feels tired. Occasionally has abdominal pain. She usually has a BM x 3 a day.  Dicyclomine helps with the cramps.  Stools are usually formed but not solid.   Seen in September with gastroenteritis.   11/201/2014 Colonoscopy: Intermittent diarrhea and rectal bleeding.  Examination performed to cecum. Single small submucosal lipoma ascending colon. Single small diverticulum at hepatic flexure. Distal rectal ulcer proximal to dentate line. I see taken for routine histology. Internal/external hemorrhoids.    Review of Systems Past Medical History:  Diagnosis Date  . Hypertension    x 14 yrs  . Rectal bleeding     Past Surgical History:  Procedure Laterality Date  . COLONOSCOPY N/A 11/30/2012   Procedure: COLONOSCOPY;  Surgeon: Rogene Houston, MD;  [facility]: [facility];  Service: Endoscopy;  Laterality: N/A;  830  . NO PAST SURGERIES      No Known Allergies  Current Outpatient Prescriptions on File Prior to Visit  Medication Sig Dispense Refill  . aspirin 81 MG tablet Take 81 mg by mouth daily.    Marland Kitchen dicyclomine (BENTYL) 10 MG capsule Take 1 capsule (10 mg total) by mouth 3 (three) times daily. 90 capsule 4  . ferrous sulfate 325 (65 FE) MG EC tablet Take 325 mg by mouth 3 (three) times daily with meals.    Marland Kitchen omeprazole (PRILOSEC) 40 MG capsule Take 1 capsule (40 mg total) by mouth daily. 90 capsule 3  . Probiotic Product ([place]) CAPS Take by mouth.    . triamterene-hydrochlorothiazide (DYAZIDE) 37.5-25 MG per capsule Take 1 capsule by mouth daily.    . Wheat Dextrin (BENEFIBER  DRINK MIX PO) Take by mouth.     No current facility-administered medications on file prior to visit.        Objective:   Physical Exam Blood pressure 134/79, pulse 64, temperature 98.6 F (37 C), height 5\' 3"  (1.6 m), weight 139 lb 11.2 oz (63.4 kg). Alert and oriented. Skin warm and dry. Oral mucosa is moist.   . Sclera anicteric, conjunctivae is pink. Thyroid not enlarged. No cervical lymphadenopathy. Lungs clear. Heart regular rate and rhythm.  Abdomen is soft. Bowel sounds are positive. No hepatomegaly. No abdominal masses felt. No tenderness.  No edema to lower extremities.          Assessment & Plan:  IBS. She is doing well.  IBS controlled with Dicyclomine. OV in 1 year.

## 2015-12-09 NOTE — Patient Instructions (Signed)
Continue the Dicyclomine 10mg  TID.  OV in 1 year.

## 2016-01-09 ENCOUNTER — Other Ambulatory Visit: Payer: BC Managed Care – PPO | Admitting: Obstetrics and Gynecology

## 2016-01-14 ENCOUNTER — Encounter: Payer: Self-pay | Admitting: Obstetrics and Gynecology

## 2016-01-14 ENCOUNTER — Ambulatory Visit (INDEPENDENT_AMBULATORY_CARE_PROVIDER_SITE_OTHER): Payer: BC Managed Care – PPO | Admitting: Obstetrics and Gynecology

## 2016-01-14 ENCOUNTER — Other Ambulatory Visit (HOSPITAL_COMMUNITY)
Admission: RE | Admit: 2016-01-14 | Discharge: 2016-01-14 | Disposition: A | Discharge status: Home / Self Care | Payer: BC Managed Care – PPO | Source: Ambulatory Visit | Attending: Obstetrics and Gynecology | Admitting: Obstetrics and Gynecology

## 2016-01-14 VITALS — BP 151/88 | HR 78 | Ht 63.0 in | Wt 145.0 lb

## 2016-01-14 DIAGNOSIS — N852 Hypertrophy of uterus: Secondary | ICD-10-CM | POA: Diagnosis not present

## 2016-01-14 DIAGNOSIS — Z01419 Encounter for gynecological examination (general) (routine) without abnormal findings: Secondary | ICD-10-CM | POA: Insufficient documentation

## 2016-01-14 DIAGNOSIS — Z1151 Encounter for screening for human papillomavirus (HPV): Secondary | ICD-10-CM | POA: Diagnosis present

## 2016-01-14 DIAGNOSIS — K648 Other hemorrhoids: Secondary | ICD-10-CM | POA: Diagnosis not present

## 2016-01-14 DIAGNOSIS — D259 Leiomyoma of uterus, unspecified: Secondary | ICD-10-CM | POA: Diagnosis not present

## 2016-01-14 DIAGNOSIS — D252 Subserosal leiomyoma of uterus: Secondary | ICD-10-CM

## 2016-01-14 DIAGNOSIS — D250 Submucous leiomyoma of uterus: Secondary | ICD-10-CM

## 2016-01-14 DIAGNOSIS — D251 Intramural leiomyoma of uterus: Secondary | ICD-10-CM

## 2016-01-14 NOTE — Progress Notes (Signed)
Patient ID: Christine Kidd, female   DOB: 07/03/1970, 46 y.o.   MRN: [id number] Assessment:  1. Annual Gyn Exam 2. Uterine fibroid at 16 week size 3. Prolapsed internal hemorrhoid.    Plan:  1. pap smear done, next pap due 3 years 2. Refill dyazide Rx 3. Anusol-HC cream Rx 4. Refer to PCP 5. Follow up in 2 weeks for abdominal US 6. Follow up for hysterectomy this summer 7. return annually or prn 5    Annual mammogram advised Subjective:  Christine Kidd is a 46 y.o. female No obstetric history on file. who presents for annual exam. Patient's last menstrual period was 12/22/2015. The patient has complaints today of intermittent dyspareunia due to uterine fibroids. Pt notes that she had a normal pap completed 01/01/2015. Pt reports that she has been out of her dyazide Rx x 2.5 months due to her PCP recently leaving the practice. Pt hasn't tried any medications for the relief of her symptoms. Pt denies any other symptoms.   The following portions of the patient's history were reviewed and updated as appropriate: allergies, current medications, past family history, past medical history, past social history, past surgical history and problem list. Past Medical History:  Diagnosis Date  . Hypertension    x 14 yrs  . Rectal bleeding     Past Surgical History:  Procedure Laterality Date  . COLONOSCOPY N/A 11/30/2012   Procedure: COLONOSCOPY;  Surgeon: Rogene Houston, MD;  [facility]: [facility];  Service: Endoscopy;  Laterality: N/A;  830  . NO PAST SURGERIES       Current Outpatient Prescriptions:  .  aspirin 81 MG tablet, Take 81 mg by mouth daily., Disp: , Rfl:  .  dicyclomine (BENTYL) 10 MG capsule, Take 1 capsule (10 mg total) by mouth 3 (three) times daily., Disp: 90 capsule, Rfl: 4 .  omeprazole (PRILOSEC) 40 MG capsule, Take 1 capsule (40 mg total) by mouth daily., Disp: 90 capsule, Rfl: 3 .  Probiotic Product ([place]) CAPS, Take by mouth., Disp: , Rfl:  .   Wheat Dextrin (BENEFIBER DRINK MIX PO), Take by mouth., Disp: , Rfl:  .  ferrous sulfate 325 (65 FE) MG EC tablet, Take 325 mg by mouth 3 (three) times daily with meals., Disp: , Rfl:  .  triamterene-hydrochlorothiazide (DYAZIDE) 37.5-25 MG per capsule, Take 1 capsule by mouth daily., Disp: , Rfl:   Review of Systems Constitutional: negative Gastrointestinal: negative Genitourinary: negative  Objective:  BP (!) 151/88 (BP Location: Right Arm, Patient Position: Sitting, Cuff Size: Normal)   Pulse 78   Ht 5\' 3"  (1.6 m)   Wt 145 lb (65.8 kg)   LMP 12/22/2015   BMI 25.69 kg/m    BMI: Body mass index is 25.69 kg/m.  General Appearance: Alert, appropriate appearance for age. No acute distress HEENT: Grossly normal Neck / Thyroid:  Cardiovascular: RRR; normal S1, S2, no murmur Lungs: CTA bilaterally Back: No CVAT Breast Exam: No dimpling, nipple retraction or discharge. No masses or nodes., Normal to inspection, Normal breast tissue bilaterally and No masses or nodes.No dimpling, nipple retraction or discharge. Gastrointestinal: Soft, non-tender, no masses or organomegaly Pelvic Exam: Vulva and vagina appear normal. Bimanual exam reveals normal uterus and adnexa. Vaginal: normal mucosa without prolapse or lesions, normal without tenderness, induration or masses and normal rugae Cervix: normal appearance Adnexa: normal bimanual exam Uterus: normal single, nontender and asymmetric pedunculated fibroid enlarged to 16 weeks size  Rectovaginal: not indicated and prolapsed  internal hemorrhoid.  Lymphatic Exam: Non-palpable nodes in neck, clavicular, axillary, or inguinal regions  Skin: no rash or abnormalities Neurologic: Normal gait and speech, no tremor  Psychiatric: Alert and oriented, appropriate affect.  Urinalysis:Not done   Discussion: 1. Discussed with pt risks and benefits of hysterectomy  At end of discussion, pt had opportunity to ask questions and has no further questions at  this time.   Specific discussion of hysterectomy as noted above. Greater than 50% was spent in counseling and coordination of care with the patient.   Total time greater than: 25 minutes.    Mallory Shirk. MD Pgr [phone] 3:54 PM  By signing my name below, I, Soijett Blue, attest that this documentation has been prepared under the direction and in the presence of Jonnie Kind, MD. Electronically Signed: Soijett Blue, ED Scribe. 01/14/16. 3:54 PM.  I personally performed the services described in this documentation, which was SCRIBED in my presence. The recorded information has been reviewed and considered accurate. It has been edited as necessary during review. Jonnie Kind, MD

## 2016-01-15 ENCOUNTER — Other Ambulatory Visit: Payer: Self-pay | Admitting: Obstetrics and Gynecology

## 2016-01-16 LAB — CYTOLOGY - PAP
Diagnosis: NEGATIVE
HPV: NOT DETECTED

## 2016-01-27 ENCOUNTER — Other Ambulatory Visit: Payer: Self-pay | Admitting: Obstetrics and Gynecology

## 2016-01-27 DIAGNOSIS — N852 Hypertrophy of uterus: Secondary | ICD-10-CM

## 2016-01-28 ENCOUNTER — Other Ambulatory Visit: Payer: BC Managed Care – PPO

## 2016-01-29 ENCOUNTER — Ambulatory Visit: Payer: BC Managed Care – PPO

## 2016-01-29 IMAGING — US US PELVIS COMPLETE
1 series · 14 of 25 positions shown · non-contrast
Comparison: None.

CLINICAL DATA: PELVIC PAIN

EXAM:
TRANSABDOMINAL ULTRASOUND OF PELVIS
TECHNIQUE: Transabdominal ultrasound examination of the pelvis was performed
including evaluation of the uterus, ovaries, adnexal regions, and
pelvic cul-de-sac.

[Series 1: us pelvis complete · 0.22mm/px · 14 of 55 slices shown]
[im 1/55]
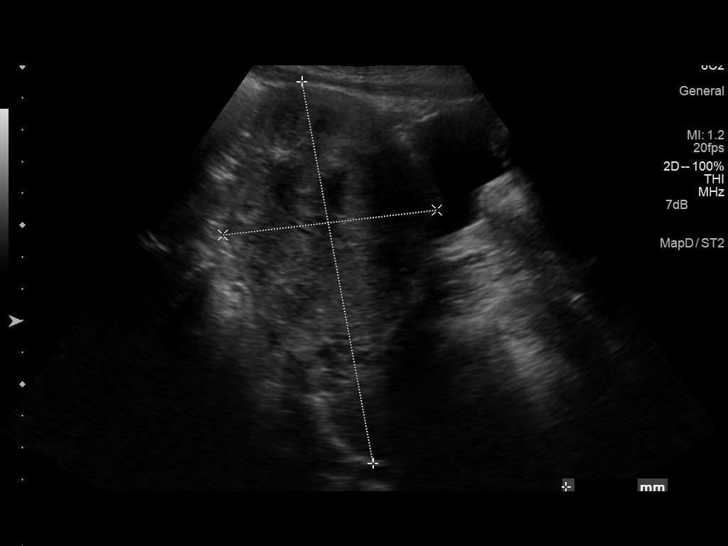
[im 5/55]
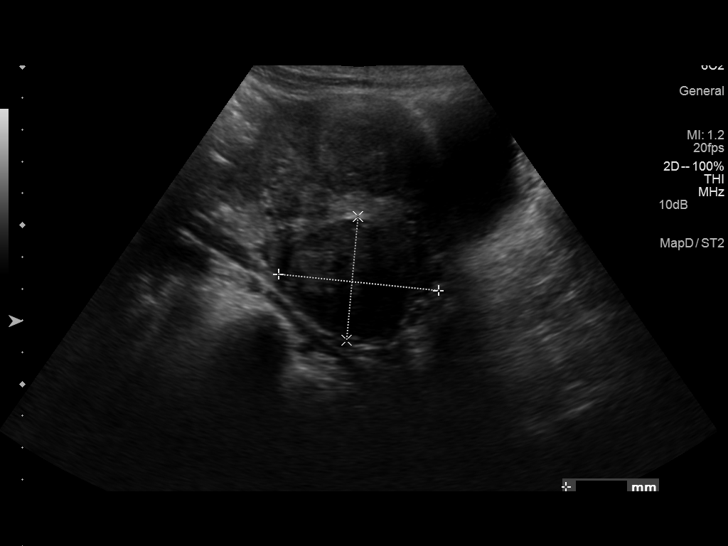
[im 10/55]
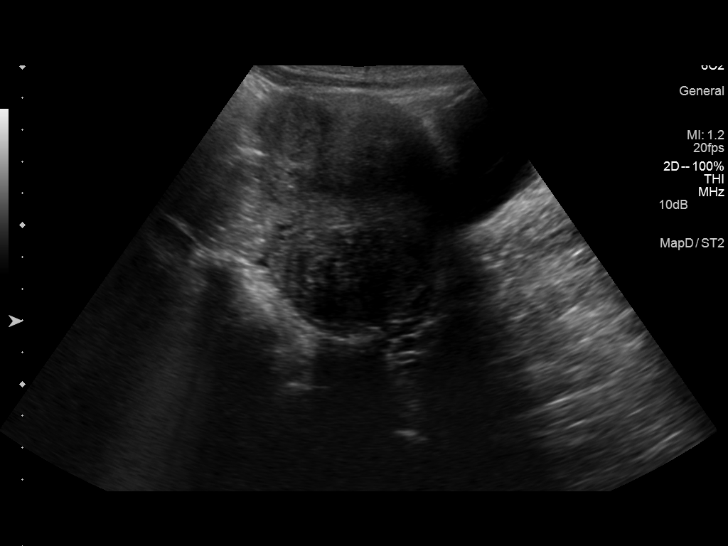
[im 14/55]
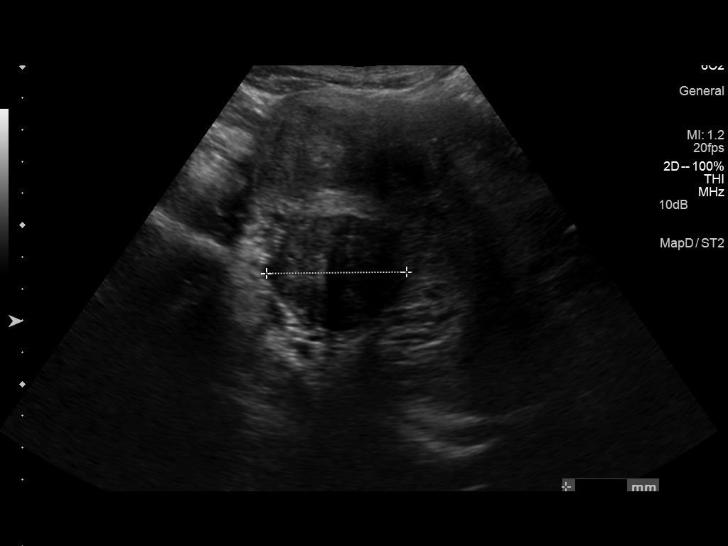
[im 19/55]
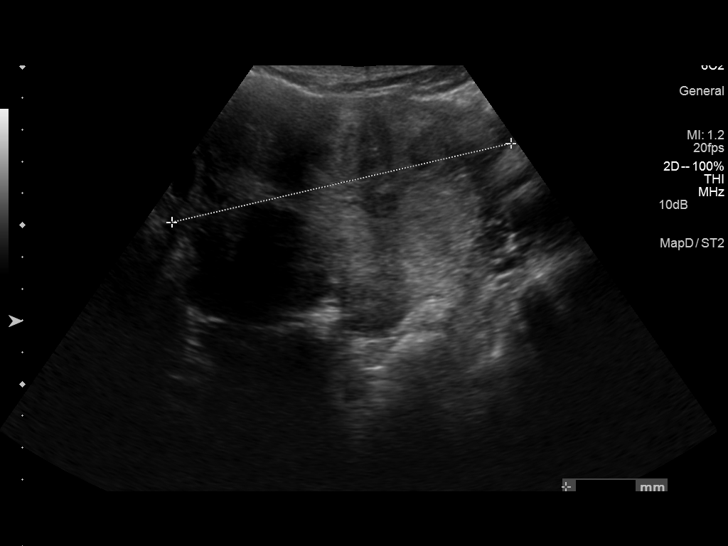
[im 21/55]
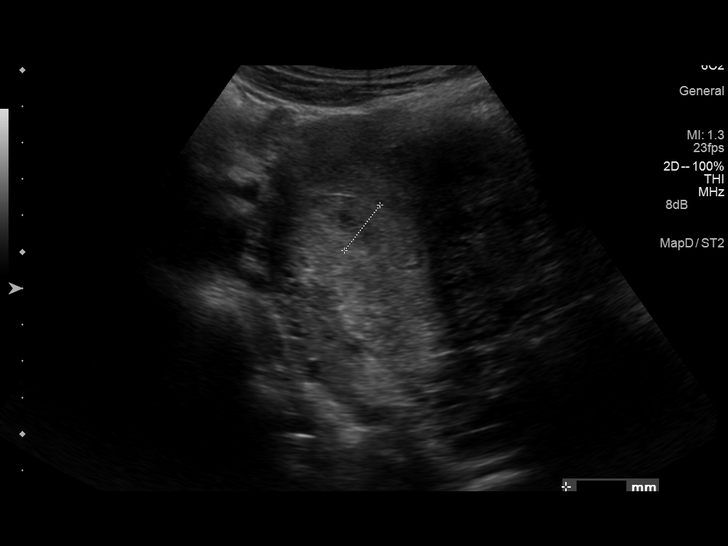
[im 25/55]
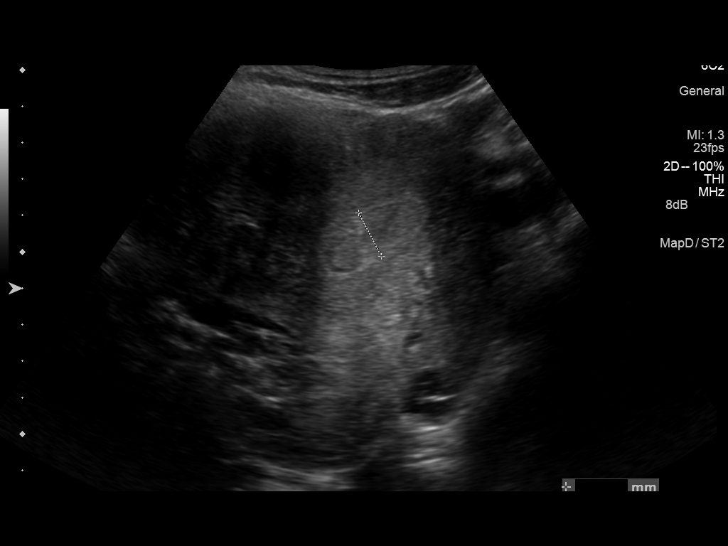
[im 30/55]
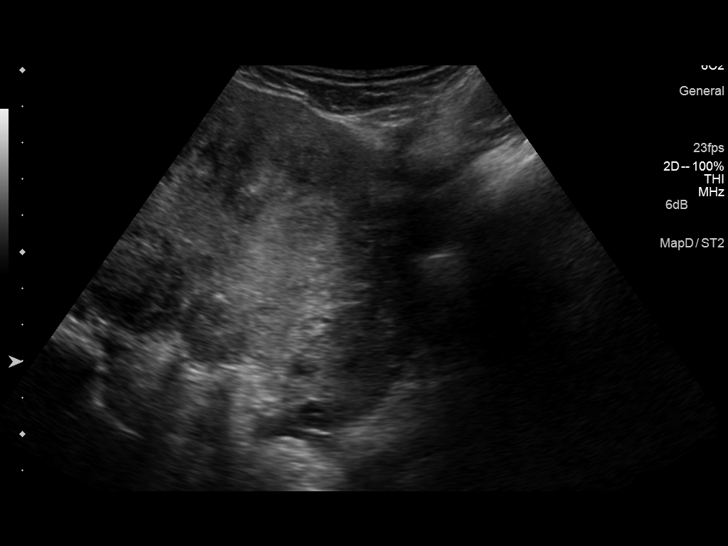
[im 34/55]
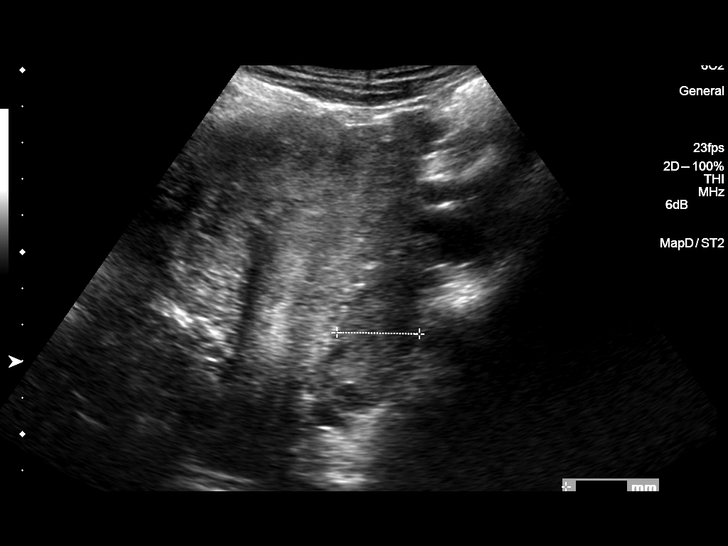
[im 37/55]
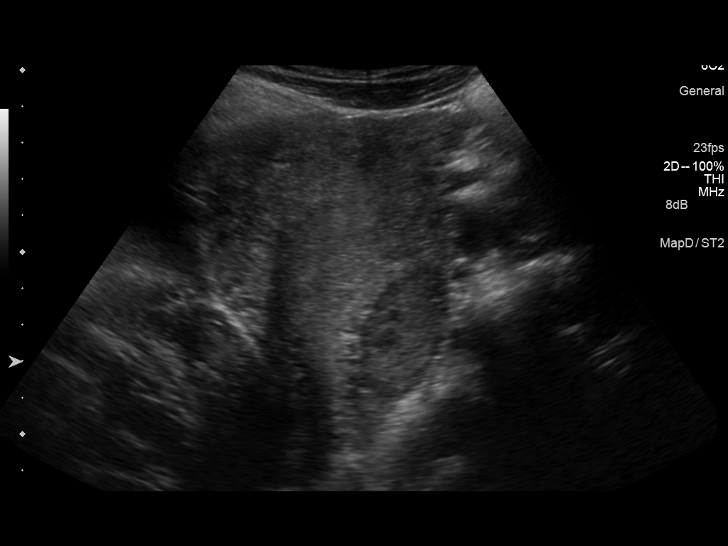
[im 41/55]
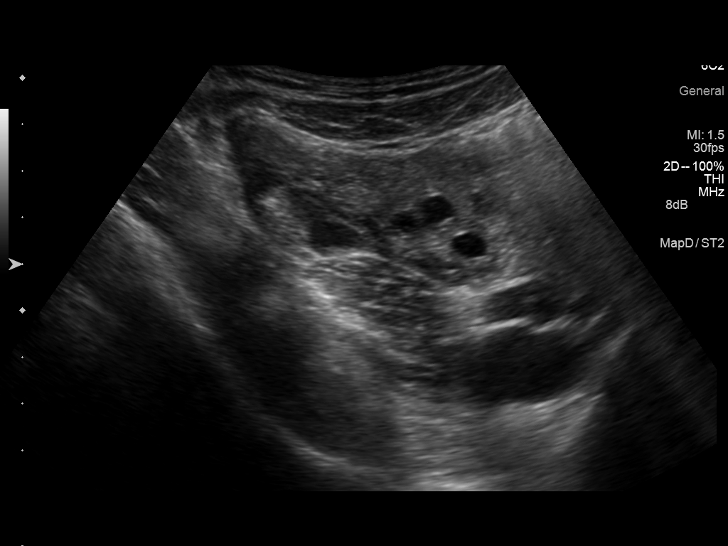
[im 46/55]
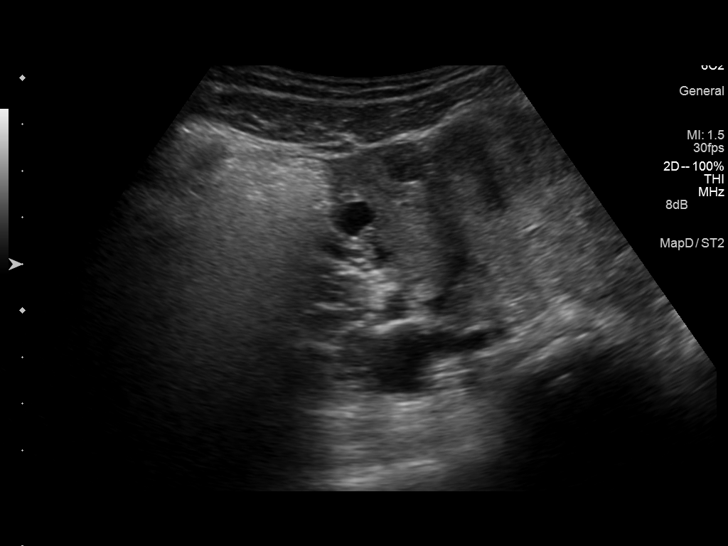
[im 50/55]
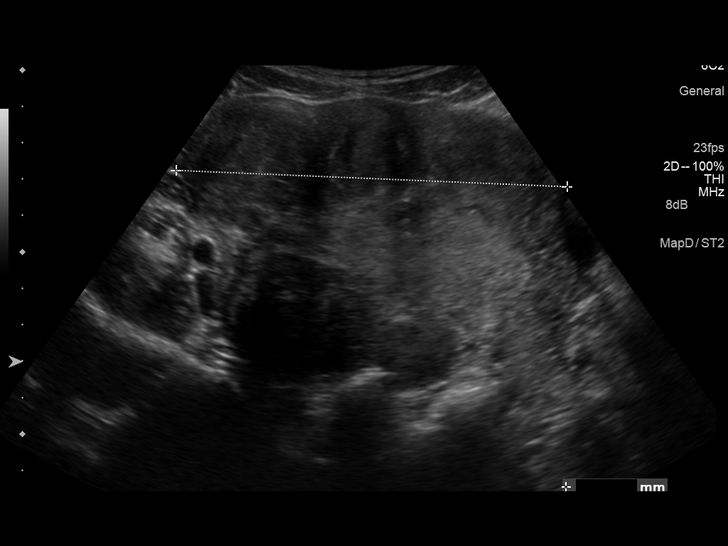
[im 55/55]
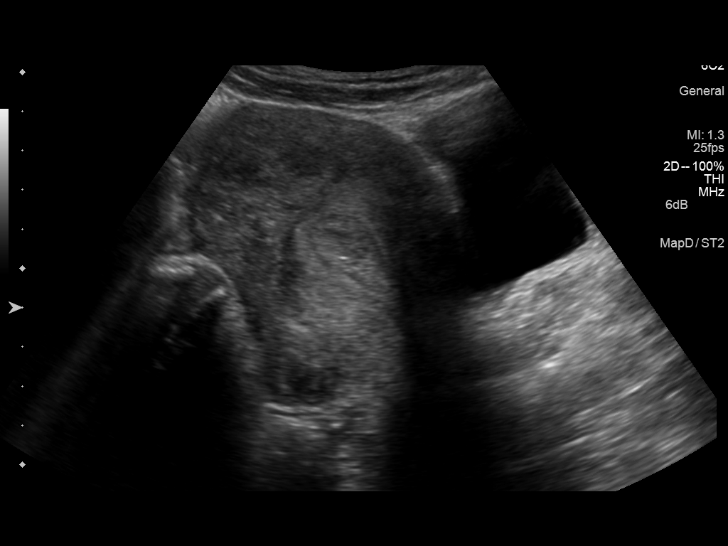

[14 of 25 positions shown; findings below may reference images not displayed]

FINDINGS: Uterus

Measurements: 12.2 x 6.8 x 10.7 cm. Enlarged with multiple uterine
fibroids, including a dominant 3.3 cm right fundal fibroid, a 5.1 cm
right lower uterine segment fibroid, and a 3.6 cm left fundal
fibroid.

Endometrium

Thickness: 18 mm.  Heterogeneous/thickened appearance.

Right ovary

Measurements: 4.4 x 2.7 x 2.6 cm. Normal appearance/no adnexal mass.

Left ovary

Measurements: 4.0 x 2.6 x 2.3 cm. Normal appearance/no adnexal mass.

Other findings:  No free fluid
IMPRESSION: Enlarged uterus with multiple uterine fibroids, measuring up to
cm, as above.

## 2016-02-19 ENCOUNTER — Ambulatory Visit (INDEPENDENT_AMBULATORY_CARE_PROVIDER_SITE_OTHER): Payer: BC Managed Care – PPO

## 2016-02-19 DIAGNOSIS — D252 Subserosal leiomyoma of uterus: Secondary | ICD-10-CM | POA: Diagnosis not present

## 2016-02-19 DIAGNOSIS — N854 Malposition of uterus: Secondary | ICD-10-CM

## 2016-02-19 DIAGNOSIS — N852 Hypertrophy of uterus: Secondary | ICD-10-CM

## 2016-02-19 DIAGNOSIS — N840 Polyp of corpus uteri: Secondary | ICD-10-CM

## 2016-02-19 NOTE — Progress Notes (Signed)
PELVIC US TA ONLY: enlarged heterogeneous anteverted uterus w/mult.fibroids,largest fibroids: (#1) post right subserosal fibroid 6.7 x 6.1 x 6.2 cm,(#2) fundal 6.2 x 4.9 x 4.8 cm,(#3) subserosal fundal right 5.5 x 5 x  4.8 cm,endometrial polyp 3.4 x 1.5 x 3.6 cm with a feeding vessel,EEC 14.4 cm,normal ov's bilat,no free fluid

## 2016-03-09 ENCOUNTER — Telehealth: Payer: Self-pay | Admitting: *Deleted

## 2016-03-09 NOTE — Telephone Encounter (Signed)
LMOVM

## 2016-03-11 ENCOUNTER — Telehealth: Payer: Self-pay | Admitting: Obstetrics and Gynecology

## 2016-03-11 NOTE — Telephone Encounter (Signed)
Informed patient of large fibroid uterus, and a polyp in the uterus that may be contributing to bleeding. A followup appointment to discuss options with the patient would be helpful. Patient states she had discussed with Dr Glo Herring at her last visit, that if surgery was necessary, she would like to wait until June when she is out of work for summer break. States she will make follow-up appointment later this month to discuss options.

## 2016-04-09 ENCOUNTER — Ambulatory Visit
Admission: RE | Admit: 2016-04-09 | Discharge: 2016-04-09 | Disposition: A | Discharge status: Home / Self Care | Payer: BC Managed Care – PPO | Source: Ambulatory Visit | Attending: Obstetrics and Gynecology | Admitting: Obstetrics and Gynecology

## 2016-04-09 DIAGNOSIS — Z1231 Encounter for screening mammogram for malignant neoplasm of breast: Secondary | ICD-10-CM

## 2016-04-28 ENCOUNTER — Encounter: Payer: Self-pay | Admitting: Obstetrics and Gynecology

## 2016-04-28 ENCOUNTER — Ambulatory Visit (INDEPENDENT_AMBULATORY_CARE_PROVIDER_SITE_OTHER): Payer: BC Managed Care – PPO | Admitting: Obstetrics and Gynecology

## 2016-04-28 VITALS — BP 122/80 | HR 80 | Ht 63.0 in | Wt 145.4 lb

## 2016-04-28 DIAGNOSIS — N840 Polyp of corpus uteri: Secondary | ICD-10-CM

## 2016-04-28 DIAGNOSIS — D252 Subserosal leiomyoma of uterus: Secondary | ICD-10-CM

## 2016-04-28 DIAGNOSIS — D250 Submucous leiomyoma of uterus: Secondary | ICD-10-CM | POA: Diagnosis not present

## 2016-04-28 DIAGNOSIS — D251 Intramural leiomyoma of uterus: Secondary | ICD-10-CM | POA: Diagnosis not present

## 2016-04-28 DIAGNOSIS — N920 Excessive and frequent menstruation with regular cycle: Secondary | ICD-10-CM | POA: Diagnosis not present

## 2016-04-28 NOTE — Progress Notes (Signed)
[facility] Visit  @DATE @            Patient name: Christine Kidd MRN [id number]  Date of birth: [birth year]  CC & HPI:  Christine Kidd is a 46 y.o. female presenting today to discuss treatment for uterine fibroids and polyp. She reports periods becoming heavier as well as abdominal discomfort. She has been explained options of hysteroscopy to remove the endometrial polyp, with her then living with the fibroids,versus considering hysterectomy.  After consideration, the pt has decided that she wants hysterectomy. She will need preop endometrial biopsy before hysterectomy.  Plans will be for endometrial biopsy in May, with hysterectomy in mid June, after school is out.  ROS:  ROS +heavy periods +abdominal discomfort  Pertinent History Reviewed:   Reviewed: Significant for uterine fibroids Medical         Past Medical History:  Diagnosis Date   Hypertension    x 14 yrs   Rectal bleeding                               Surgical Hx:    Past Surgical History:  Procedure Laterality Date   COLONOSCOPY N/A 11/30/2012   Procedure: COLONOSCOPY;  Surgeon: Rogene Houston, MD;  [facility];  Service: Endoscopy;  Laterality: N/A;  830   NO PAST SURGERIES     Medications: Reviewed & Updated - see associated section                       Current Outpatient Prescriptions:    aspirin 81 MG tablet, Take 81 mg by mouth daily., Disp: , Rfl:    dicyclomine (BENTYL) 10 MG capsule, Take 1 capsule (10 mg total) by mouth 3 (three) times daily., Disp: 90 capsule, Rfl: 4   ferrous sulfate 325 (65 FE) MG EC tablet, Take 325 mg by mouth 3 (three) times daily with meals., Disp: , Rfl:    omeprazole (PRILOSEC) 40 MG capsule, Take 1 capsule (40 mg total) by mouth daily., Disp: 90 capsule, Rfl: 3   Probiotic Product ([place]) CAPS, Take by mouth., Disp: , Rfl:    triamterene-hydrochlorothiazide (DYAZIDE) 37.5-25 MG per capsule, Take 1 capsule by mouth daily.,  Disp: , Rfl:    triamterene-hydrochlorothiazide (MAXZIDE-25) 37.5-25 MG tablet, TAKE 1 TABLET BY MOUTH EVERY DAY, Disp: 90 tablet, Rfl: 2   Wheat Dextrin (BENEFIBER DRINK MIX PO), Take by mouth., Disp: , Rfl:    Social History: Reviewed -  reports that she has never smoked. She has never used smokeless tobacco.  Objective Findings:  Vitals: Blood pressure 122/80, pulse 80, height 5\' 3"  (1.6 m), weight 145 lb 6.4 oz (66 kg), last menstrual period 04/16/2016.  Physical Examination: discussion only  Discussion: hysterectomy vs endometrial ablation vs IUD (with polyp removal)  Specific discussion of each procedure including risks and benefits as noted above. Greater than 50% was spent in counseling and coordination of care with the patient.   At end of discussion, pt had opportunity to ask questions and has no further questions at this time.   Total time greater than: 25 minutes.    Assessment & Plan:   A:  1. Uterine fibroids 2. Endometrial polyp 3. Menorrhagia   P:  1. Follow up in 4-4.5 weeks for endometrial biopsy and pre-op visit 2. Schedule hysterectomy in June    By signing my name below, I,  Sonum Patel, attest that this documentation has been prepared under the direction and in the presence of Jonnie Kind, MD. Electronically Signed: Sonum Patel, Education administrator. 04/28/16. 4:57 PM.  I personally performed the services described in this documentation, which was SCRIBED in my presence. The recorded information has been reviewed and considered accurate. It has been edited as necessary during review. Jonnie Kind, MD

## 2016-05-26 ENCOUNTER — Other Ambulatory Visit: Payer: BC Managed Care – PPO | Admitting: Obstetrics and Gynecology

## 2016-06-14 ENCOUNTER — Other Ambulatory Visit: Payer: Self-pay | Admitting: Obstetrics and Gynecology

## 2016-06-14 ENCOUNTER — Ambulatory Visit (INDEPENDENT_AMBULATORY_CARE_PROVIDER_SITE_OTHER): Payer: BC Managed Care – PPO | Admitting: Obstetrics and Gynecology

## 2016-06-14 ENCOUNTER — Encounter: Payer: Self-pay | Admitting: Obstetrics and Gynecology

## 2016-06-14 VITALS — BP 112/74 | HR 76 | Wt 146.6 lb

## 2016-06-14 DIAGNOSIS — Z1159 Encounter for screening for other viral diseases: Secondary | ICD-10-CM | POA: Diagnosis not present

## 2016-06-14 DIAGNOSIS — N840 Polyp of corpus uteri: Secondary | ICD-10-CM | POA: Diagnosis not present

## 2016-06-14 DIAGNOSIS — D250 Submucous leiomyoma of uterus: Secondary | ICD-10-CM

## 2016-06-14 DIAGNOSIS — D252 Subserosal leiomyoma of uterus: Secondary | ICD-10-CM

## 2016-06-14 DIAGNOSIS — N920 Excessive and frequent menstruation with regular cycle: Secondary | ICD-10-CM

## 2016-06-14 DIAGNOSIS — D251 Intramural leiomyoma of uterus: Secondary | ICD-10-CM

## 2016-06-14 DIAGNOSIS — Z118 Encounter for screening for other infectious and parasitic diseases: Secondary | ICD-10-CM | POA: Diagnosis not present

## 2016-06-14 DIAGNOSIS — Z3202 Encounter for pregnancy test, result negative: Secondary | ICD-10-CM

## 2016-06-14 LAB — POCT URINE PREGNANCY: PREG TEST UR: NEGATIVE

## 2016-06-14 MED ORDER — OXYCODONE-ACETAMINOPHEN 5-325 MG PO TABS: 1.0000 | ORAL_TABLET | Freq: Four times a day (QID) | ORAL | 0 refills | Status: DC | PRN

## 2016-06-14 NOTE — Progress Notes (Signed)
Christine Kidd is a 46 y.o. female  Per chart review: pt with 15 week size uterus seen on [place]; normal pap on 01/14/2016.   She presents today for endometrial biopsy secondary to abdominal pain and menorrhagia. If results are benign pt will be scheduled for hysterectomy.   Endometrial Biopsy: Patient given informed consent, signed copy in the chart, time out was performed. Time out taken. The patient was placed in the lithotomy position and the cervix brought into view with sterile speculum.  Portio of cervix cleansed x 2 with betadine swabs.  A tenaculum was placed in the anterior lip of the cervix. The uterus was sounded for depth of 10 cm, Milex uterine Explora 3 mm was introduced to into the uterus, suction created,  and an endometrial sample was obtained. All equipment was removed and accounted for. The patient tolerated the procedure well. Patient given post procedure instructions.    Preoperative History and Physical  Christine Kidd is a 46 y.o. here for surgical management of uterine fibroids, endometrial polyp, and menorrhagia.   No significant preoperative concerns.  Proposed surgery: hysterectomy   Review of systems: Otherwise negative for acute change except as noted above.  Past Medical History:  Diagnosis Date  . Hypertension    x 14 yrs  . Rectal bleeding    Past Surgical History:  Procedure Laterality Date  . COLONOSCOPY N/A 11/30/2012   Procedure: COLONOSCOPY;  Surgeon: Rogene Houston, MD;  Location: [facility];  Service: Endoscopy;  Laterality: N/A;  830  . NO PAST SURGERIES     OB History  No data available  Patient denies any other pertinent gynecologic issues.   Current Outpatient Prescriptions on File Prior to Visit  Medication Sig Dispense Refill  . aspirin 81 MG tablet Take 81 mg by mouth daily.    Marland Kitchen dicyclomine (BENTYL) 10 MG capsule Take 1 capsule (10 mg total) by mouth 3 (three) times daily. 90 capsule 4  . ferrous sulfate 325 (65 FE) MG EC  tablet Take 325 mg by mouth 3 (three) times daily with meals.    Marland Kitchen omeprazole (PRILOSEC) 40 MG capsule Take 1 capsule (40 mg total) by mouth daily. 90 capsule 3  . Probiotic Product ([place]) CAPS Take by mouth.    . triamterene-hydrochlorothiazide (DYAZIDE) 37.5-25 MG per capsule Take 1 capsule by mouth daily.    Marland Kitchen triamterene-hydrochlorothiazide (MAXZIDE-25) 37.5-25 MG tablet TAKE 1 TABLET BY MOUTH EVERY DAY 90 tablet 2  . Wheat Dextrin (BENEFIBER DRINK MIX PO) Take by mouth.     No current facility-administered medications on file prior to visit.    No Known Allergies  Social History:   reports that she has never smoked. She has never used smokeless tobacco. She reports that she does not drink alcohol or use drugs.  Family History  Problem Relation Age of Onset  . Colon cancer Neg Hx     PHYSICAL EXAM: Blood pressure 112/74, pulse 76, weight 146 lb 9.6 oz (66.5 kg), last menstrual period 05/30/2016. General appearance - alert, well appearing, and in no distress Chest - clear to auscultation, no wheezes, rales or rhonchi, symmetric air entry Heart - normal rate and regular rhythm Abdomen - soft, nontender, nondistended, no masses or organomegaly Pelvic exam: VULVA: normal appearing vulva with no masses, tenderness or lesions, VAGINA: normal appearing vagina with normal color and discharge, no lesions, CERVIX: large cervix UTERUS: first degree uterine descensus, uterus enlarged 16 week size  Extremities - peripheral  pulses normal, no pedal edema, no clubbing or cyanosis  Labs: Results for orders placed or performed in visit on 06/14/16 (from the past 336 hour(s))  POCT urine pregnancy   Collection Time: 06/14/16  2:17 PM  Result Value Ref Range   Preg Test, Ur Negative Negative    Imaging Studies: No results found.  Assessment: Patient Active Problem List   Diagnosis Date Noted  . Endometrial polyp 04/28/2016  . Menorrhagia with regular cycle 04/28/2016  .  Intramural, submucous, and subserous leiomyoma of uterus 01/22/2016  . Viral gastroenteritis 10/08/2013  . IBS (irritable bowel syndrome) 04/03/2013  . GERD (gastroesophageal reflux disease) 01/16/2013  . Essential hypertension, benign 11/20/2012  . Rectal bleeding 11/20/2012    Plan: Patient will undergo surgical management with hysterectomy via pfanstiehl incision in next 1-2 weeks.   By signing my name below, I, Christine Kidd, attest that this documentation has been prepared under the direction and in the presence of Jonnie Kind, MD . Electronically Signed: Evelene Kidd, Scribe. 06/14/2016. 2:40 PM. I personally performed the services described in this documentation, which was SCRIBED in my presence. The recorded information has been reviewed and considered accurate. It has been edited as necessary during review. Jonnie Kind, MD

## 2016-06-16 LAB — GC/CHLAMYDIA PROBE AMP
Chlamydia trachomatis, NAA: NEGATIVE
NEISSERIA GONORRHOEAE BY PCR: NEGATIVE

## 2016-06-17 ENCOUNTER — Telehealth: Payer: Self-pay | Admitting: Obstetrics and Gynecology

## 2016-06-17 ENCOUNTER — Telehealth: Payer: Self-pay | Admitting: *Deleted

## 2016-06-17 NOTE — Telephone Encounter (Signed)
Left message for patient to call the office [phone]  to complete surgical plans

## 2016-06-17 NOTE — Telephone Encounter (Signed)
Informed patient of surgical date of 07/06/16

## 2016-06-29 NOTE — Patient Instructions (Signed)
Christine Kidd  06/29/2016     @PREFPERIOPPHARMACY @   Your procedure is scheduled on 07/06/2016.  Report to [facility] at 6:15 A.M.  Call this number if you have problems the morning of surgery:  [phone]   Remember:  Do not eat food or drink liquids after midnight.  Take these medicines the morning of surgery with A SIP OF WATER Bentyl if needed, Prilosec, Oxycodone if needed   Do not wear jewelry, make-up or nail polish.  Do not wear lotions, powders, or perfumes, or deoderant.  Do not shave 48 hours prior to surgery.  Men may shave face and neck.  Do not bring valuables to the hospital.  [facility] is not responsible for any belongings or valuables.  Contacts, dentures or bridgework may not be worn into surgery.  Leave your suitcase in the car.  After surgery it may be brought to your room.  For patients admitted to the hospital, discharge time will be determined by your treatment team.  Patients discharged the day of surgery will not be allowed to drive home.    Please read over the following fact sheets that you were given. Surgical Site Infection Prevention and Anesthesia Post-op Instructions     PATIENT INSTRUCTIONS POST-ANESTHESIA  IMMEDIATELY FOLLOWING SURGERY:  Do not drive or operate machinery for the first twenty four hours after surgery.  Do not make any important decisions for twenty four hours after surgery or while taking narcotic pain medications or sedatives.  If you develop intractable nausea and vomiting or a severe headache please notify your doctor immediately.  FOLLOW-UP:  Please make an appointment with your surgeon as instructed. You do not need to follow up with anesthesia unless specifically instructed to do so.  WOUND CARE INSTRUCTIONS (if applicable):  Keep a dry clean dressing on the anesthesia/puncture wound site if there is drainage.  Once the wound has quit draining you may leave it open to air.  Generally you should leave the bandage  intact for twenty four hours unless there is drainage.  If the epidural site drains for more than 36-48 hours please call the anesthesia department.  QUESTIONS?:  Please feel free to call your physician or the hospital operator if you have any questions, and they will be happy to assist you.      Salpingectomy Salpingectomy, also called tubectomy, is the surgical removal of one of the fallopian tubes. The fallopian tubes are where eggs travel from the ovaries to the uterus. Removing one fallopian tube does not prevent you from becoming pregnant. It also does not cause problems with your menstrual periods. You may need a salpingectomy if you:  Have a fertilized egg that attaches to the fallopian tube (ectopic pregnancy), especially one that causes the tube to burst or tear (rupture).  Have an infected fallopian tube.  Have cancer of the fallopian tube or nearby organs.  Have had an ovary removed due to a cyst or tumor.  Have had your uterus removed.  There are three different methods that can be used for a salpingectomy:  Open. This method involves making one large incision in your abdomen.  Laparoscopic. This method involves using a thin, lighted tube with a tiny camera on the end (laparoscope) to help perform the procedure. The laparoscope will allow your surgeon to make several small incisions in the abdomen instead of a large incision.  Robot-assisted: This method involves using a computer to control surgical instruments that are attached to robotic arms.  Tell a health care provider about:  Any allergies you have.  All medicines you are taking, including vitamins, herbs, eye drops, creams, and over-the-counter medicines.  Any problems you or family members have had with anesthetic medicines.  Any blood disorders you have.  Any surgeries you have had.  Any medical conditions you have.  Whether you are pregnant or may be pregnant. What are the risks? Generally, this is a  safe procedure. However, problems may occur, including:  Infection.  Bleeding.  Allergic reactions to medicines.  Damage to other structures or organs.  Blood clots in the legs or lungs.  What happens before the procedure? Staying hydrated Follow instructions from your health care provider about hydration, which may include:  Up to 2 hours before the procedure - you may continue to drink clear liquids, such as water, clear fruit juice, black coffee, and plain tea.  Eating and drinking restrictions Follow instructions from your health care provider about eating and drinking, which may include:  8 hours before the procedure - stop eating heavy meals or foods such as meat, fried foods, or fatty foods.  6 hours before the procedure - stop eating light meals or foods, such as toast or cereal.  6 hours before the procedure - stop drinking milk or drinks that contain milk.  2 hours before the procedure - stop drinking clear liquids.  Medicines  Ask your health care provider about: ? Changing or stopping your regular medicines. This is especially important if you are taking diabetes medicines or blood thinners. ? Taking medicines such as aspirin and ibuprofen. These medicines can thin your blood. Do not take these medicines before your procedure if your health care provider instructs you not to.  You may be given antibiotic medicine to help prevent infection. General instructions  Do not smoke for at least 2 weeks before your procedure. If you need help quitting, ask your health care provider.  You may have an exam or tests, such as an electrocardiogram (ECG).  You may have a blood or urine sample taken.  Ask your health care provider: ? Whether you should stop removing hair from your surgical area. ? How your surgical site will be marked or identified.  You may be asked to shower with a germ-killing soap.  Plan to have someone take you home from the hospital or  clinic.  If you will be going home right after the procedure, plan to have someone with you for 24 hours. What happens during the procedure?  To reduce your risk of infection: ? Your health care team will wash or sanitize their hands. ? Hair may be removed from the surgical area. ? Your skin will be washed with soap.  An IV tube will be inserted into one of your veins.  You will be given a medicine to make you fall asleep (general anesthetic). You may also be given a medicine to help you relax (sedative).  A thin tube (catheter) may be inserted through your urethra and into your bladder to drain urine during your procedure.  Depending on the type of procedure you are having, one incision or several small incisions will be made in your abdomen.  Your fallopian tube will be cut and removed from where it attaches to your uterus.  Your blood vessels will be clamped and tied to prevent excess bleeding.  The incision(s) in your abdomen will be closed with stitches (sutures), staples, or skin glue.  A bandage (dressing) may be placed over  your incision(s). The procedure may vary among health care providers and hospitals. What happens after the procedure?  Your blood pressure, heart rate, breathing rate, and blood oxygen level will be monitored until the medicines you were given have worn off.  You may continue to receive fluids and medicines through an IV tube.  You may continue to have a catheter draining your urine.  You may have to wear compression stockings. These stockings help to prevent blood clots and reduce swelling in your legs.  You will be given pain medicine as needed.  Do not drive for 24 hours if you received a sedative. Summary  Salpingectomy is a surgical procedure to remove one of the fallopian tubes.  The procedure may be done with an open incision, with a laparoscope, or with computer-controlled instruments.  Depending on the type of procedure you are having,  one incision or several small incisions will be made in your abdomen.  Your blood pressure, heart rate, breathing rate, and blood oxygen level will be monitored until the medicines you were given have worn off.  Plan to have someone take you home from the hospital or clinic. This information is not intended to replace advice given to you by your health care provider. Make sure you discuss any questions you have with your health care provider. Document Released: 05/16/2008 Document Revised: 08/15/2015 Document Reviewed: 06/21/2012 Elsevier Interactive Patient Education  2018 [place] Hysterectomy A supracervical hysterectomy is surgery to remove the top part of the uterus, but not the cervix. You will no longer have menstrual periods or be able to get pregnant after this surgery. The fallopian tubes and ovaries may also be removed (bilateral salpingo-oophorectomy) during this surgery. This surgery is usually performed using a minimally invasive technique called laparoscopy. This technique allows the surgery to be done through small incisions. The minimally invasive technique provides benefits such as less pain, less risk of infection, and shorter recovery time. Tell a health care provider about:  Any allergies you have.  All medicines you are taking, including vitamins, herbs, eye drops, creams, and over-the-counter medicines.  Any problems you or family members have had with anesthetic medicines.  Any blood disorders you have.  Any surgeries you have had.  Any medical conditions you have. What are the risks? Generally, this is a safe procedure. However, as with any procedure, complications can occur. Possible complications include:  Bleeding.  Blood clots in the legs or lung.  Infection.  Injury to surrounding organs.  Problems related to anesthesia.  Conversion to an open abdominal surgery.  Additional surgery later to remove the cervix if you have problems  with the cervix.  What happens before the procedure?  Ask your health care provider about changing or stopping your regular medicines.  Do not take aspirin or blood thinners (anticoagulants) for 1 week before the surgery, or as directed by your health care provider.  Do not eat or drink anything for 8 hours before the surgery, or as directed by your health care provider.  Quit smoking if you smoke.  Arrange for a ride home after surgery and for someone to help you at home during recovery. What happens during the procedure?  You will be given an antibiotic medicine.  An IV tube will be placed in one of your veins. You will be given medicine to make you sleep (general anesthetic).  A gas (carbon dioxide) will be used to inflate your abdomen. This will allow your surgeon to look inside your  abdomen, perform your surgery, and treat any other problems found if necessary.  Three or four small incisions will be made in your abdomen. One of these incisions will be made in the area of your belly button (navel). A thin, flexible tube with a tiny camera and light on the end of it (laparoscope) will be inserted into the incision. The camera on the laparoscope sends a picture to a TV screen in the operating room. This gives your surgeon a good view inside the abdomen.  Other surgical instruments will be inserted through the other incisions.  The uterus will be cut into small pieces and removed through the small incisions.  Your incisions will be closed. What happens after the procedure?  You will be taken to a recovery area where your progress will be monitored until you are awake, stable, and taking fluids well. If there are no other problems, you will then be moved to a regular hospital room, or you will be allowed to go home.  You will likely have minimal discomfort after the surgery because the incisions are so small with the laparoscopic technique.  You will be given pain medicine while you  are in the hospital and for when you go home.  If a bilateral salpingo-oophorectomy was performed before menopause, you will go through a sudden (abrupt) menopause. This can be helped with hormone medicines. This information is not intended to replace advice given to you by your health care provider. Make sure you discuss any questions you have with your health care provider. Document Released: 06/16/2007 Document Revised: 06/05/2015 Document Reviewed: 06/30/2012 Elsevier Interactive Patient Education  [employer].

## 2016-07-01 ENCOUNTER — Other Ambulatory Visit: Payer: Self-pay

## 2016-07-01 ENCOUNTER — Encounter (HOSPITAL_COMMUNITY): Payer: Self-pay

## 2016-07-01 ENCOUNTER — Other Ambulatory Visit: Payer: Self-pay | Admitting: Obstetrics and Gynecology

## 2016-07-01 ENCOUNTER — Encounter (HOSPITAL_COMMUNITY)
Admission: RE | Admit: 2016-07-01 | Discharge: 2016-07-01 | Disposition: A | Discharge status: Home / Self Care | Payer: BC Managed Care – PPO | Source: Ambulatory Visit | Attending: Obstetrics and Gynecology | Admitting: Obstetrics and Gynecology

## 2016-07-01 DIAGNOSIS — Z01818 Encounter for other preprocedural examination: Secondary | ICD-10-CM | POA: Insufficient documentation

## 2016-07-01 HISTORY — DX: Gastro-esophageal reflux disease without esophagitis: K21.9

## 2016-07-01 HISTORY — DX: Anemia, unspecified: D64.9

## 2016-07-01 LAB — CBC
HCT: 37.8 % (ref 36.0–46.0)
Hemoglobin: 12.5 g/dL (ref 12.0–15.0)
MCH: 30.5 pg (ref 26.0–34.0)
MCHC: 33.1 g/dL (ref 30.0–36.0)
MCV: 92.2 fL (ref 78.0–100.0)
Platelets: 260 10*3/uL (ref 150–400)
RBC: 4.1 MIL/uL (ref 3.87–5.11)
RDW: 12.6 % (ref 11.5–15.5)
WBC: 7 10*3/uL (ref 4.0–10.5)

## 2016-07-01 LAB — TYPE AND SCREEN
ABO/RH(D): B POS
Antibody Screen: NEGATIVE

## 2016-07-01 LAB — HCG, SERUM, QUALITATIVE: PREG SERUM: NEGATIVE

## 2016-07-01 LAB — COMPREHENSIVE METABOLIC PANEL
ALT: 14 U/L (ref 14–54)
AST: 17 U/L (ref 15–41)
Albumin: 4 g/dL (ref 3.5–5.0)
Alkaline Phosphatase: 30 U/L — ABNORMAL LOW (ref 38–126)
Anion gap: 7 (ref 5–15)
BILIRUBIN TOTAL: 0.5 mg/dL (ref 0.3–1.2)
BUN: 12 mg/dL (ref 6–20)
CO2: 28 mmol/L (ref 22–32)
CREATININE: 0.78 mg/dL (ref 0.44–1.00)
Calcium: 8.9 mg/dL (ref 8.9–10.3)
Chloride: 102 mmol/L (ref 101–111)
GFR calc Af Amer: >60 mL/min (ref >60)
Glucose, Bld: 90 mg/dL (ref 65–99)
Potassium: 4.1 mmol/L (ref 3.5–5.1)
Sodium: 137 mmol/L (ref 135–145)
TOTAL PROTEIN: 6.6 g/dL (ref 6.5–8.1)

## 2016-07-01 LAB — URINALYSIS, ROUTINE W REFLEX MICROSCOPIC
BILIRUBIN URINE: NEGATIVE
Glucose, UA: NEGATIVE mg/dL
KETONES UR: NEGATIVE mg/dL
LEUKOCYTES UA: NEGATIVE
Nitrite: NEGATIVE
PH: 7 (ref 5.0–8.0)
Protein, ur: NEGATIVE mg/dL
Specific Gravity, Urine: 1.008 (ref 1.005–1.030)

## 2016-07-01 LAB — SURGICAL PCR SCREEN
MRSA, PCR: NEGATIVE
Staphylococcus aureus: NEGATIVE

## 2016-07-05 NOTE — H&P (Signed)
Versions: 1. Irving Shows at 06/14/2016 2:47 PM - Shared  Expand All Collapse All   Christine Kidd is a 46 y.o. female  Per chart review: pt with 15 week size uterus seen on [place]; normal pap on 01/14/2016.   She presents today for endometrial biopsy secondary to abdominal pain and menorrhagia. If results are benign pt will be scheduled for hysterectomy.   Endometrial Biopsy: Patient given informed consent, signed copy in the chart, time out was performed. Time out taken. The patient was placed in the lithotomy position and the cervix brought into view with sterile speculum.  Portio of cervix cleansed x 2 with betadine swabs.  A tenaculum was placed in the anterior lip of the cervix. The uterus was sounded for depth of 10 cm, Milex uterine Explora 3 mm was introduced to into the uterus, suction created,  and an endometrial sample was obtained. All equipment was removed and accounted for. The patient tolerated the procedure well. Patient given post procedure instructions.    Preoperative History and Physical  Christine Kidd is a 46 y.o. here for surgical management of uterine fibroids, endometrial polyp, and menorrhagia.   No significant preoperative concerns.  Proposed surgery: hysterectomy   Review of systems: Otherwise negative for acute change except as noted above.      Past Medical History:  Diagnosis Date  . Hypertension    x 14 yrs  . Rectal bleeding         Past Surgical History:  Procedure Laterality Date  . COLONOSCOPY N/A 11/30/2012   Procedure: COLONOSCOPY;  Surgeon: Rogene Houston, MD;  Location: [facility];  Service: Endoscopy;  Laterality: N/A;  830  . NO PAST SURGERIES     OB History  No data available  Patient denies any other pertinent gynecologic issues.         Current Outpatient Prescriptions on File Prior to Visit  Medication Sig Dispense Refill  . aspirin 81 MG tablet Take 81 mg by mouth daily.    Marland Kitchen dicyclomine (BENTYL) 10  MG capsule Take 1 capsule (10 mg total) by mouth 3 (three) times daily. 90 capsule 4  . ferrous sulfate 325 (65 FE) MG EC tablet Take 325 mg by mouth 3 (three) times daily with meals.    Marland Kitchen omeprazole (PRILOSEC) 40 MG capsule Take 1 capsule (40 mg total) by mouth daily. 90 capsule 3  . Probiotic Product ([place]) CAPS Take by mouth.    . triamterene-hydrochlorothiazide (DYAZIDE) 37.5-25 MG per capsule Take 1 capsule by mouth daily.    Marland Kitchen triamterene-hydrochlorothiazide (MAXZIDE-25) 37.5-25 MG tablet TAKE 1 TABLET BY MOUTH EVERY DAY 90 tablet 2  . Wheat Dextrin (BENEFIBER DRINK MIX PO) Take by mouth.     No current facility-administered medications on file prior to visit.    No Known Allergies  Social History:   reports that she has never smoked. She has never used smokeless tobacco. She reports that she does not drink alcohol or use drugs.       Family History  Problem Relation Age of Onset  . Colon cancer Neg Hx     PHYSICAL EXAM: Blood pressure 112/74, pulse 76, weight 146 lb 9.6 oz (66.5 kg), last menstrual period 05/30/2016. General appearance - alert, well appearing, and in no distress Chest - clear to auscultation, no wheezes, rales or rhonchi, symmetric air entry Heart - normal rate and regular rhythm Abdomen - soft, nontender, nondistended, no masses or organomegaly Pelvic exam:  VULVA: normal appearing vulva with no masses, tenderness or lesions, VAGINA: normal appearing vagina with normal color and discharge, no lesions, CERVIX: large cervix UTERUS: first degree uterine descensus, uterus enlarged 16 week size  Extremities - peripheral pulses normal, no pedal edema, no clubbing or cyanosis  Labs:      Results for orders placed or performed in visit on 06/14/16 (from the past 336 hour(s))  POCT urine pregnancy   Collection Time: 06/14/16  2:17 PM  Result Value Ref Range   Preg Test, Ur Negative Negative   CBC    Component Value Date/Time    WBC 7.0 07/01/2016 1248   RBC 4.10 07/01/2016 1248   HGB 12.5 07/01/2016 1248   HCT 37.8 07/01/2016 1248   PLT 260 07/01/2016 1248   MCV 92.2 07/01/2016 1248   MCH 30.5 07/01/2016 1248   MCHC 33.1 07/01/2016 1248   RDW 12.6 07/01/2016 1248   LYMPHSABS 0.4 (L) 12/09/2014 0957   MONOABS 0.3 12/09/2014 0957   EOSABS 0.1 12/09/2014 0957   BASOSABS 0.0 12/09/2014 0957   CMP Latest Ref Rng & Units 07/01/2016 10/08/2013  Glucose 65 - 99 mg/dL 90 77  BUN 6 - 20 mg/dL 12 16  Creatinine 0.44 - 1.00 mg/dL 0.78 1.05  Sodium 135 - 145 mmol/L 137 135  Potassium 3.5 - 5.1 mmol/L 4.1 4.5  Chloride 101 - 111 mmol/L 102 97  CO2 22 - 32 mmol/L 28 26  Calcium 8.9 - 10.3 mg/dL 8.9 10.2  Total Protein 6.5 - 8.1 g/dL 6.6 8.0  Total Bilirubin 0.3 - 1.2 mg/dL 0.5 0.6  Alkaline Phos 38 - 126 U/L 30(L) 41  AST 15 - 41 U/L 17 27  ALT 14 - 54 U/L 14 26     Imaging Studies: ImagingResults  No results found.    Assessment:     Patient Active Problem List   Diagnosis Date Noted  . Endometrial polyp 04/28/2016  . Menorrhagia with regular cycle 04/28/2016  . Intramural, submucous, and subserous leiomyoma of uterus 01/22/2016  . Viral gastroenteritis 10/08/2013  . IBS (irritable bowel syndrome) 04/03/2013  . GERD (gastroesophageal reflux disease) 01/16/2013  . Essential hypertension, benign 11/20/2012  . Rectal bleeding 11/20/2012    Plan: Patient will undergo surgical management with hysterectomy via pfanstiehl incision in next 07/06/2017  By signing my name below, I, Evelene Croon, attest that this documentation has been prepared under the direction and in the presence of Jonnie Kind, MD . Electronically Signed: Evelene Croon, Scribe. 06/14/2016. 2:40 PM. I personally performed the services described in this documentation, which was SCRIBED in my presence. The recorded information has been reviewed and considered accurate. It has been edited as necessary during review. Jonnie Kind,  MD

## 2016-07-06 ENCOUNTER — Inpatient Hospital Stay (HOSPITAL_COMMUNITY): Payer: BC Managed Care – PPO | Admitting: Anesthesiology

## 2016-07-06 ENCOUNTER — Encounter (HOSPITAL_COMMUNITY)
Admission: RE | Disposition: A | Discharge status: Home / Self Care | Payer: Self-pay | Source: Ambulatory Visit | Attending: Obstetrics and Gynecology

## 2016-07-06 ENCOUNTER — Encounter (HOSPITAL_COMMUNITY): Payer: Self-pay | Admitting: *Deleted

## 2016-07-06 ENCOUNTER — Inpatient Hospital Stay (HOSPITAL_COMMUNITY)
Admission: RE | Admit: 2016-07-06 | Discharge: 2016-07-07 | DRG: 743 | Disposition: A | Discharge status: Home / Self Care | Payer: BC Managed Care – PPO | Source: Ambulatory Visit | Attending: Obstetrics and Gynecology | Admitting: Obstetrics and Gynecology

## 2016-07-06 DIAGNOSIS — N801 Endometriosis of ovary: Secondary | ICD-10-CM | POA: Diagnosis present

## 2016-07-06 DIAGNOSIS — N840 Polyp of corpus uteri: Secondary | ICD-10-CM | POA: Diagnosis present

## 2016-07-06 DIAGNOSIS — D251 Intramural leiomyoma of uterus: Secondary | ICD-10-CM | POA: Diagnosis not present

## 2016-07-06 DIAGNOSIS — Z7982 Long term (current) use of aspirin: Secondary | ICD-10-CM

## 2016-07-06 DIAGNOSIS — K219 Gastro-esophageal reflux disease without esophagitis: Secondary | ICD-10-CM | POA: Diagnosis present

## 2016-07-06 DIAGNOSIS — I10 Essential (primary) hypertension: Secondary | ICD-10-CM | POA: Diagnosis present

## 2016-07-06 DIAGNOSIS — N920 Excessive and frequent menstruation with regular cycle: Secondary | ICD-10-CM | POA: Diagnosis present

## 2016-07-06 DIAGNOSIS — D252 Subserosal leiomyoma of uterus: Secondary | ICD-10-CM | POA: Diagnosis present

## 2016-07-06 DIAGNOSIS — K589 Irritable bowel syndrome without diarrhea: Secondary | ICD-10-CM | POA: Diagnosis present

## 2016-07-06 DIAGNOSIS — N858 Other specified noninflammatory disorders of uterus: Secondary | ICD-10-CM | POA: Diagnosis not present

## 2016-07-06 DIAGNOSIS — N8301 Follicular cyst of right ovary: Secondary | ICD-10-CM

## 2016-07-06 DIAGNOSIS — Z90711 Acquired absence of uterus with remaining cervical stump: Secondary | ICD-10-CM | POA: Diagnosis present

## 2016-07-06 HISTORY — PX: SUPRACERVICAL ABDOMINAL HYSTERECTOMY: SHX5393

## 2016-07-06 HISTORY — PX: BILATERAL SALPINGECTOMY: SHX5743

## 2016-07-06 HISTORY — PX: OOPHORECTOMY: SHX6387

## 2016-07-06 SURGERY — HYSTERECTOMY, SUPRACERVICAL, ABDOMINAL
Anesthesia: General | Laterality: Right

## 2016-07-06 MED ORDER — PROPOFOL 10 MG/ML IV BOLUS
INTRAVENOUS | Status: DC | PRN
  Administered 2016-07-06: 130 mg via INTRAVENOUS

## 2016-07-06 MED ORDER — SODIUM CHLORIDE 0.9% FLUSH: 9.0000 mL | INTRAVENOUS | Status: DC | PRN

## 2016-07-06 MED ORDER — KETOROLAC TROMETHAMINE 30 MG/ML IJ SOLN: 30.0000 mg | Freq: Four times a day (QID) | INTRAMUSCULAR | Status: DC

## 2016-07-06 MED ORDER — SODIUM CHLORIDE 0.9 % IV SOLN
INTRAVENOUS | Status: DC
  Administered 2016-07-06 – 2016-07-07 (×3): via INTRAVENOUS

## 2016-07-06 MED ORDER — SUCCINYLCHOLINE CHLORIDE 20 MG/ML IJ SOLN
INTRAMUSCULAR | Status: AC
  Filled 2016-07-06: qty 1

## 2016-07-06 MED ORDER — IBUPROFEN 600 MG PO TABS: 600.0000 mg | ORAL_TABLET | Freq: Four times a day (QID) | ORAL | Status: DC | PRN

## 2016-07-06 MED ORDER — NALOXONE HCL 0.4 MG/ML IJ SOLN: 0.4000 mg | INTRAMUSCULAR | Status: DC | PRN

## 2016-07-06 MED ORDER — EPHEDRINE SULFATE 50 MG/ML IJ SOLN
INTRAMUSCULAR | Status: AC
  Filled 2016-07-06: qty 1

## 2016-07-06 MED ORDER — DEXAMETHASONE SODIUM PHOSPHATE 4 MG/ML IJ SOLN
INTRAMUSCULAR | Status: DC | PRN
  Administered 2016-07-06: 4 mg via INTRAVENOUS

## 2016-07-06 MED ORDER — KETOROLAC TROMETHAMINE 30 MG/ML IJ SOLN
30.0000 mg | Freq: Four times a day (QID) | INTRAMUSCULAR | Status: DC
  Administered 2016-07-06 – 2016-07-07 (×4): 30 mg via INTRAVENOUS
  Filled 2016-07-06 (×4): qty 1

## 2016-07-06 MED ORDER — LACTATED RINGERS IV SOLN: INTRAVENOUS | Status: DC

## 2016-07-06 MED ORDER — BUPIVACAINE HCL (PF) 0.5 % IJ SOLN
INTRAMUSCULAR | Status: DC | PRN
  Administered 2016-07-06: 10 mL

## 2016-07-06 MED ORDER — KETOROLAC TROMETHAMINE 30 MG/ML IJ SOLN
30.0000 mg | Freq: Once | INTRAMUSCULAR | Status: AC
  Administered 2016-07-06: 30 mg via INTRAVENOUS
  Filled 2016-07-06: qty 1

## 2016-07-06 MED ORDER — FENTANYL CITRATE (PF) 250 MCG/5ML IJ SOLN
INTRAMUSCULAR | Status: AC
  Filled 2016-07-06: qty 5

## 2016-07-06 MED ORDER — PROMETHAZINE HCL 25 MG/ML IJ SOLN: 6.2500 mg | INTRAMUSCULAR | Status: DC | PRN

## 2016-07-06 MED ORDER — ONDANSETRON HCL 4 MG/2ML IJ SOLN
INTRAMUSCULAR | Status: DC | PRN
  Administered 2016-07-06: 4 mg via INTRAVENOUS

## 2016-07-06 MED ORDER — ACETAMINOPHEN 325 MG PO TABS
650.0000 mg | ORAL_TABLET | Freq: Once | ORAL | Status: AC
  Administered 2016-07-06: 650 mg via ORAL

## 2016-07-06 MED ORDER — HYDROMORPHONE HCL 1 MG/ML IJ SOLN
1.0000 mg | INTRAMUSCULAR | Status: DC | PRN
  Administered 2016-07-07: 1 mg via INTRAVENOUS
  Filled 2016-07-06: qty 1

## 2016-07-06 MED ORDER — DIPHENHYDRAMINE HCL 12.5 MG/5ML PO ELIX: 12.5000 mg | ORAL_SOLUTION | Freq: Four times a day (QID) | ORAL | Status: DC | PRN

## 2016-07-06 MED ORDER — LIDOCAINE HCL (CARDIAC) 20 MG/ML IV SOLN
INTRAVENOUS | Status: DC | PRN
  Administered 2016-07-06: 40 mg via INTRAVENOUS

## 2016-07-06 MED ORDER — MIDAZOLAM HCL 2 MG/2ML IJ SOLN
INTRAMUSCULAR | Status: AC
  Filled 2016-07-06: qty 2

## 2016-07-06 MED ORDER — ROCURONIUM BROMIDE 50 MG/5ML IV SOLN
INTRAVENOUS | Status: AC
Start: 2016-07-06 — End: 2016-07-06
  Filled 2016-07-06: qty 1

## 2016-07-06 MED ORDER — 0.9 % SODIUM CHLORIDE (POUR BTL) OPTIME
TOPICAL | Status: DC | PRN
Start: 2016-07-06 — End: 2016-07-06
  Administered 2016-07-06 (×2): 1000 mL

## 2016-07-06 MED ORDER — ROCURONIUM BROMIDE 100 MG/10ML IV SOLN
INTRAVENOUS | Status: DC | PRN
  Administered 2016-07-06: 10 mg via INTRAVENOUS
  Administered 2016-07-06: 40 mg via INTRAVENOUS
  Administered 2016-07-06: 10 mg via INTRAVENOUS

## 2016-07-06 MED ORDER — FENTANYL CITRATE (PF) 100 MCG/2ML IJ SOLN
INTRAMUSCULAR | Status: DC | PRN
  Administered 2016-07-06 (×3): 50 ug via INTRAVENOUS
  Administered 2016-07-06 (×2): 100 ug via INTRAVENOUS
  Administered 2016-07-06: 50 ug via INTRAVENOUS

## 2016-07-06 MED ORDER — OXYCODONE HCL 5 MG PO TABS: 5.0000 mg | ORAL_TABLET | Freq: Once | ORAL | Status: DC | PRN

## 2016-07-06 MED ORDER — EPHEDRINE SULFATE 50 MG/ML IJ SOLN
INTRAMUSCULAR | Status: DC | PRN
  Administered 2016-07-06: 10 mg via INTRAVENOUS

## 2016-07-06 MED ORDER — FENTANYL CITRATE (PF) 100 MCG/2ML IJ SOLN
25.0000 ug | INTRAMUSCULAR | Status: DC | PRN
Start: 2016-07-06 — End: 2016-07-06
  Administered 2016-07-06: 50 ug via INTRAVENOUS
  Filled 2016-07-06: qty 2

## 2016-07-06 MED ORDER — LIDOCAINE HCL (PF) 1 % IJ SOLN
INTRAMUSCULAR | Status: AC
  Filled 2016-07-06: qty 5

## 2016-07-06 MED ORDER — ACETAMINOPHEN 325 MG PO TABS: 650.0000 mg | ORAL_TABLET | Freq: Once | ORAL | Status: DC

## 2016-07-06 MED ORDER — TRIAMTERENE-HCTZ 37.5-25 MG PO TABS
1.0000 | ORAL_TABLET | Freq: Every day | ORAL | Status: DC
  Administered 2016-07-07: 1 via ORAL
  Filled 2016-07-06: qty 1

## 2016-07-06 MED ORDER — LACTATED RINGERS IV SOLN
INTRAVENOUS | Status: DC
  Administered 2016-07-06 (×4): via INTRAVENOUS

## 2016-07-06 MED ORDER — DIPHENHYDRAMINE HCL 50 MG/ML IJ SOLN: 12.5000 mg | Freq: Four times a day (QID) | INTRAMUSCULAR | Status: DC | PRN

## 2016-07-06 MED ORDER — PROPOFOL 10 MG/ML IV BOLUS
INTRAVENOUS | Status: AC
  Filled 2016-07-06: qty 40

## 2016-07-06 MED ORDER — ONDANSETRON HCL 4 MG/2ML IJ SOLN
4.0000 mg | Freq: Four times a day (QID) | INTRAMUSCULAR | Status: DC | PRN
  Administered 2016-07-07: 4 mg via INTRAVENOUS
  Filled 2016-07-06: qty 2

## 2016-07-06 MED ORDER — NEOSTIGMINE METHYLSULFATE 10 MG/10ML IV SOLN
INTRAVENOUS | Status: DC | PRN
  Administered 2016-07-06: 4 mg via INTRAVENOUS

## 2016-07-06 MED ORDER — MIDAZOLAM HCL 2 MG/2ML IJ SOLN
1.0000 mg | Freq: Once | INTRAMUSCULAR | Status: AC | PRN
  Administered 2016-07-06: 1 mg via INTRAVENOUS

## 2016-07-06 MED ORDER — SODIUM CHLORIDE 0.9 % IJ SOLN
INTRAMUSCULAR | Status: AC
  Filled 2016-07-06: qty 10

## 2016-07-06 MED ORDER — SEVOFLURANE IN SOLN
RESPIRATORY_TRACT | Status: AC
Start: 2016-07-06 — End: 2016-07-06
  Filled 2016-07-06: qty 250

## 2016-07-06 MED ORDER — PANTOPRAZOLE SODIUM 40 MG PO TBEC: 80.0000 mg | DELAYED_RELEASE_TABLET | Freq: Every day | ORAL | Status: DC

## 2016-07-06 MED ORDER — OXYCODONE-ACETAMINOPHEN 5-325 MG PO TABS
1.0000 | ORAL_TABLET | ORAL | Status: DC | PRN
  Administered 2016-07-06 – 2016-07-07 (×2): 2 via ORAL
  Filled 2016-07-06 (×3): qty 2

## 2016-07-06 MED ORDER — PANTOPRAZOLE SODIUM 40 MG PO TBEC
40.0000 mg | DELAYED_RELEASE_TABLET | Freq: Every day | ORAL | Status: DC
  Administered 2016-07-07: 40 mg via ORAL
  Filled 2016-07-06: qty 1

## 2016-07-06 MED ORDER — ONDANSETRON HCL 4 MG PO TABS: 4.0000 mg | ORAL_TABLET | Freq: Four times a day (QID) | ORAL | Status: DC | PRN

## 2016-07-06 MED ORDER — HYDROMORPHONE 1 MG/ML IV SOLN
INTRAVENOUS | Status: DC
  Administered 2016-07-06: 25 mg via INTRAVENOUS
  Filled 2016-07-06 (×2): qty 25

## 2016-07-06 MED ORDER — CEFAZOLIN SODIUM-DEXTROSE 2-4 GM/100ML-% IV SOLN
2.0000 g | INTRAVENOUS | Status: AC
Start: 2016-07-06 — End: 2016-07-06
  Administered 2016-07-06: 2 g via INTRAVENOUS
  Filled 2016-07-06: qty 100

## 2016-07-06 MED ORDER — ACETAMINOPHEN 325 MG PO TABS
ORAL_TABLET | ORAL | Status: AC
  Filled 2016-07-06: qty 2

## 2016-07-06 MED ORDER — ONDANSETRON HCL 4 MG/2ML IJ SOLN: 4.0000 mg | Freq: Four times a day (QID) | INTRAMUSCULAR | Status: DC | PRN

## 2016-07-06 MED ORDER — BUPIVACAINE HCL (PF) 0.5 % IJ SOLN
INTRAMUSCULAR | Status: AC
  Filled 2016-07-06: qty 30

## 2016-07-06 MED ORDER — GLYCOPYRROLATE 0.2 MG/ML IJ SOLN
INTRAMUSCULAR | Status: DC | PRN
  Administered 2016-07-06: 0.6 mg via INTRAVENOUS

## 2016-07-06 MED ORDER — DEXAMETHASONE SODIUM PHOSPHATE 4 MG/ML IJ SOLN
INTRAMUSCULAR | Status: AC
  Filled 2016-07-06: qty 1

## 2016-07-06 MED ORDER — ROCURONIUM BROMIDE 50 MG/5ML IV SOLN
INTRAVENOUS | Status: AC
  Filled 2016-07-06: qty 1

## 2016-07-06 MED ORDER — ONDANSETRON HCL 4 MG/2ML IJ SOLN
INTRAMUSCULAR | Status: AC
  Filled 2016-07-06: qty 2

## 2016-07-06 SURGICAL SUPPLY — 60 items
APL SKNCLS STERI-STRIP NONHPOA (GAUZE/BANDAGES/DRESSINGS) ×3
BAG HAMPER (MISCELLANEOUS) ×5 IMPLANT
BENZOIN TINCTURE PRP APPL 2/3 (GAUZE/BANDAGES/DRESSINGS) ×2 IMPLANT
BLADE SURG SZ10 CARB STEEL (BLADE) ×5 IMPLANT
CELLS DAT CNTRL 66122 CELL SVR (MISCELLANEOUS) IMPLANT
CLOSURE WOUND 1/2 X4 (GAUZE/BANDAGES/DRESSINGS) ×1
CLOTH BEACON ORANGE TIMEOUT ST (SAFETY) ×5 IMPLANT
COVER LIGHT HANDLE STERIS (MISCELLANEOUS) ×10 IMPLANT
DRAPE WARM FLUID 44X44 (DRAPE) ×5 IMPLANT
DRSG OPSITE POSTOP 4X10 (GAUZE/BANDAGES/DRESSINGS) ×5 IMPLANT
DURAPREP 26ML APPLICATOR (WOUND CARE) ×5 IMPLANT
ELECT REM PT RETURN 9FT ADLT (ELECTROSURGICAL) ×5
ELECTRODE REM PT RTRN 9FT ADLT (ELECTROSURGICAL) ×3 IMPLANT
EVACUATOR DRAINAGE 10X20 100CC (DRAIN) IMPLANT
EVACUATOR SILICONE 100CC (DRAIN)
FORMALIN 10 PREFIL 480ML (MISCELLANEOUS) ×5 IMPLANT
GLOVE BIO SURGEON STRL SZ 6.5 (GLOVE) ×1 IMPLANT
GLOVE BIO SURGEON STRL SZ7 (GLOVE) ×2 IMPLANT
GLOVE BIO SURGEONS STRL SZ 6.5 (GLOVE) ×1
GLOVE BIOGEL PI IND STRL 7.0 (GLOVE) ×3 IMPLANT
GLOVE BIOGEL PI IND STRL 9 (GLOVE) ×3 IMPLANT
GLOVE BIOGEL PI INDICATOR 7.0 (GLOVE) ×8
GLOVE BIOGEL PI INDICATOR 9 (GLOVE) ×2
GLOVE ECLIPSE 6.5 STRL STRAW (GLOVE) ×2 IMPLANT
GLOVE ECLIPSE 9.0 STRL (GLOVE) ×5 IMPLANT
GOWN SPEC L3 XXLG W/TWL (GOWN DISPOSABLE) ×5 IMPLANT
GOWN STRL REUS W/TWL LRG LVL3 (GOWN DISPOSABLE) ×10 IMPLANT
INST SET MAJOR GENERAL (KITS) ×5 IMPLANT
KIT ROOM TURNOVER APOR (KITS) ×5 IMPLANT
MANIFOLD NEPTUNE II (INSTRUMENTS) ×5 IMPLANT
NDL HYPO 25X1 1.5 SAFETY (NEEDLE) ×3 IMPLANT
NEEDLE HYPO 25X1 1.5 SAFETY (NEEDLE) ×5 IMPLANT
NS IRRIG 1000ML POUR BTL (IV SOLUTION) ×10 IMPLANT
PACK ABDOMINAL MAJOR (CUSTOM PROCEDURE TRAY) ×5 IMPLANT
PAD ARMBOARD 7.5X6 YLW CONV (MISCELLANEOUS) ×5 IMPLANT
RETRACTOR WND ALEXIS 18 MED (MISCELLANEOUS) ×3 IMPLANT
RETRACTOR WND ALEXIS 25 LRG (MISCELLANEOUS) IMPLANT
RTRCTR WOUND ALEXIS 18CM MED (MISCELLANEOUS)
RTRCTR WOUND ALEXIS 25CM LRG (MISCELLANEOUS) ×5
SET BASIN LINEN APH (SET/KITS/TRAYS/PACK) ×5 IMPLANT
SPONGE LAP 18X18 X RAY DECT (DISPOSABLE) ×2 IMPLANT
STRIP CLOSURE SKIN 1/2X4 (GAUZE/BANDAGES/DRESSINGS) ×1 IMPLANT
SUT CHROMIC 0 CT 1 (SUTURE) ×36 IMPLANT
SUT CHROMIC 2 0 CT 1 (SUTURE) ×8 IMPLANT
SUT CHROMIC GUT BROWN 0 54 (SUTURE) IMPLANT
SUT CHROMIC GUT BROWN 0 54IN (SUTURE)
SUT ETHILON 3 0 FSL (SUTURE) IMPLANT
SUT PDS AB CT VIOLET #0 27IN (SUTURE) IMPLANT
SUT PLAIN CT 1/2CIR 2-0 27IN (SUTURE) ×5 IMPLANT
SUT PROLENE 0 CT 1 30 (SUTURE) IMPLANT
SUT VIC AB 0 CT1 27 (SUTURE) ×10
SUT VIC AB 0 CT1 27XBRD ANTBC (SUTURE) IMPLANT
SUT VIC AB 0 CT1 27XCR 8 STRN (SUTURE) IMPLANT
SUT VICRYL 4 0 KS 27 (SUTURE) ×2 IMPLANT
SUT VICRYL AB 2 0 TIES (SUTURE) ×5 IMPLANT
SWABSTK COMLB BENZOIN TINCTURE (MISCELLANEOUS) ×2 IMPLANT
SYR CONTROL 10ML LL (SYRINGE) ×5 IMPLANT
TOWEL BLUE STERILE X RAY DET (MISCELLANEOUS) ×5 IMPLANT
TOWEL OR 17X26 4PK STRL BLUE (TOWEL DISPOSABLE) ×5 IMPLANT
TRAY FOLEY W/METER SILVER 16FR (SET/KITS/TRAYS/PACK) ×5 IMPLANT

## 2016-07-06 NOTE — Progress Notes (Signed)
Day of Surgery Procedure(s) (LRB): HYSTERECTOMY SUPRACERVICAL ABDOMINAL (N/A) BILATERAL SALPINGECTOMY (Bilateral) RIGHT OOPHORECTOMY (Right)  Subjective: Patient reports unable to rest with pca due to alert going off.    Objective: I have reviewed patient's vital signs and intake and output.  General: sleepy but coherent .dressing dry  Assessment: s/p Procedure(s): HYSTERECTOMY SUPRACERVICAL ABDOMINAL (N/A) BILATERAL SALPINGECTOMY (Bilateral) RIGHT OOPHORECTOMY (Right): stable  Plan: d/c PCA, switch to prn dilaudid IV.  LOS: 0 days    Farha Dano V 07/06/2016, 4:54 PM

## 2016-07-06 NOTE — Brief Op Note (Signed)
07/06/2016  10:26 AM  PATIENT:  Christine Kidd  46 y.o. female  PRE-OPERATIVE DIAGNOSIS:  Symptomatic Uterine Fibroids Endometrial Polyp Patient request, cancer risk reduction for bilateral salpingectomy Menorrhagia  POST-OPERATIVE DIAGNOSIS:  Symptomatic Uterine Fibroids Endometrial Polyp Patient request, cancer risk reduction for bilateral salpingectomy Menorrhagia Endometriosis right ovary  PROCEDURE:  Procedure(s): HYSTERECTOMY SUPRACERVICAL ABDOMINAL (N/A) BILATERAL SALPINGECTOMY (Bilateral) RIGHT OOPHORECTOMY (Right)  SURGEON:  Surgeon(s) and Role:    Jonnie Kind, MD - Primary  PHYSICIAN ASSISTANT:   ASSISTANTS: [place] RNFA   ANESTHESIA:   local and general  EBL:  Total I/O In: 2600 [I.V.:2600] Out: 300 [Urine:100; Blood:200]  BLOOD ADMINISTERED:none  DRAINS: Urinary Catheter (Foley)   LOCAL MEDICATIONS USED:  MARCAINE    and Amount: 10 ml subcutaneous  SPECIMEN:  Source of Specimen:  Uterus bilateral fallopian tubes right ovary  DISPOSITION OF SPECIMEN:  PATHOLOGY  COUNTS:  YES  TOURNIQUET:  * No tourniquets in log *  DICTATION: .Dragon Dictation  PLAN OF CARE: Admit to inpatient   PATIENT DISPOSITION:  PACU - hemodynamically stable.   Delay start of Pharmacological VTE agent (>24hrs) due to surgical blood loss or risk of bleeding: not applicable

## 2016-07-06 NOTE — Anesthesia Procedure Notes (Signed)
Procedure Name: Intubation Date/Time: 07/06/2016 7:43 AM Performed by: Andree Elk, AMY A Pre-anesthesia Checklist: Patient identified, Patient being monitored, Timeout performed, Emergency Drugs available and Suction available Patient Re-evaluated:Patient Re-evaluated prior to inductionOxygen Delivery Method: Circle System Utilized Preoxygenation: Pre-oxygenation with 100% oxygen Intubation Type: IV induction Ventilation: Mask ventilation without difficulty Laryngoscope Size: Miller and 3 Grade View: Grade I Tube type: Oral Tube size: 7.0 mm Number of attempts: 1 Airway Equipment and Method: Stylet Placement Confirmation: ETT inserted through vocal cords under direct vision,  positive ETCO2 and breath sounds checked- equal and bilateral Secured at: 21 cm Tube secured with: Tape Dental Injury: Teeth and Oropharynx as per pre-operative assessment

## 2016-07-06 NOTE — Op Note (Signed)
07/06/2016  10:26 AM  PATIENT:  Christine Kidd  46 y.o. female  PRE-OPERATIVE DIAGNOSIS:  Symptomatic Uterine Fibroids Endometrial Polyp Patient request, cancer risk reduction for bilateral salpingectomy Menorrhagia  POST-OPERATIVE DIAGNOSIS:  Symptomatic Uterine Fibroids Endometrial Polyp Patient request, cancer risk reduction for bilateral salpingectomy Menorrhagia Endometriosis right ovary  PROCEDURE:  Procedure(s): HYSTERECTOMY SUPRACERVICAL ABDOMINAL (N/A) BILATERAL SALPINGECTOMY (Bilateral) RIGHT OOPHORECTOMY (Right)  SURGEON:  Surgeon(s) and Role:    Jonnie Kind, MD - Primary  PHYSICIAN ASSISTANT:   ASSISTANTS: [place] RNFA   ANESTHESIA:   local and general  EBL:  Total I/O In: 2600 [I.V.:2600] Out: 300 [Urine:100; Blood:200]  BLOOD ADMINISTERED:none  DRAINS: Urinary Catheter (Foley)   LOCAL MEDICATIONS USED:  MARCAINE    and Amount: 10 ml subcutaneous  SPECIMEN:  Source of Specimen:  Uterus bilateral fallopian tubes right ovary  DISPOSITION OF SPECIMEN:  PATHOLOGY  COUNTS:  YES  TOURNIQUET:  * No tourniquets in log *  DICTATION: .Dragon Dictation  PLAN OF CARE: Admit to inpatient   PATIENT DISPOSITION:  PACU - hemodynamically stable.   Delay start of Pharmacological VTE agent (>24hrs) due to surgical blood loss or risk of bleeding: not applicable Findings: Large mobile uterus with multiple fibroids well above the pelvic brim. Extensive large varicosities up to 1 cm diameter in the adnexal structures, greater than normal bleeding tendency. Small dark implants on the right ovaries, right greater than left, suggestive of old endometriosis. Details of procedure patient was taken to the operating room prepped and draped for lower abdominal surgery. Uterine fibroids could be easily palpable up to midway to the umbilicus and Veress uterine incision was utilized sharply dissecting 20 cm wide incision removing a 3 cm wide ellipse of skin for improved  pelvic access. Tissues were hemostatic and after was made to save the inferior epigastric vein on each side. The fascia was opened transversely, and peritoneal cavity entered in the midline. Alexis medium retractor was positioned in place. The bowel was quite gassy and had a tendency to protrude and responded to further muscle relaxants. Attention was then directed to the pelvis with the Alexis retractor in place, and the bowel packed away with moistened laparotomy tapes and cause radiographic tape. Attention was directed to the round ligament on the left side which was approximately 1 cm diameter. It was doubly ligated transected and allowed exposure of a broad long utero-ovarian ligament. The fallopian tube on this side was cauterized, then the left ovarian ligament doubly clamped cut and suture ligated and spectacle for hemostasis. Because of an underlying fibroid. Heaney clamps 2 were placed across the vessels once they were skeletonized as well as a single Kelley clamp placed to control backbleeding. Section that was followed by double ligature using 0 Vicryl. Backbleeding was controlled with a stitch beneath the Kelly clamp. A second clamping was necessary due to the tendency for the veins to slide. The right side was treated similarly. With the right side we were able to exteriorize the uterus for improved access the bladder flap had been developed and the bladder was well out of the way. Upper cardinal ligaments were clamped cut and suture ligated on either side using straight Heaney clamp knife dissection and 0 chromic suture ligature. The patient's left side and the uterine artery incorporated in this lamp Heaney and transection to its relatively deep site and difficult ability to access with the previous 8 curved Heaney clampings. At this time the bowel was protected by malleable placed behind the  uterus and then the lower uterine segment and uterine body amputated off of the maintaining 2 cm cervix  length. The amputation was of the uterus was done and a conical fashion so that the cuff of the cervix could be closed front to back with interrupted figure-of-eight sutures. Stasis was good at this time. Careful attention to the point cautery was used as necessary on the anterior portion of the remaining cervical stump. We were well away from the bladder. Attention was then directed to the pelvis irrigation was performed and pedicles inspected. Hemostasis was satisfactory. Salpingectomy followed.  Salpingectomy consisted of isolating the uterine vessels beneath the tube in the mesosalpinx and clamping across a portion of the mesosalpinx laterally clamping cutting and suture ligating with 0 chromic. The left side hemostasis was good. On the right side initial efforts appeared satisfactory laterally but the d proximal clamping across the vessels was tied off and there was significant bleeding as a vessel had retracted during the transection and ligation process. 2 additional efforts were made to clamp and suture ligate in a way that achieve hemostasis and preserve the ovary. Unfortunately the vessel had retracted into the right IP ligament such that the right IP ligament was developing this hematoma 2 sutures were placed try to control this. It became apparent that hemostasis was a problem. It was felt that sacrifice the ovary to achieve hemostasis was warranted. It was well out of the way. The right IP ligament was then clamped cut and suture ligated with good hemostasis achieved since inspected several times and hemostasis confirmed, ureter was well out of the way., Having been visualized through the retroperitoneum and peristalsis noted. Pelvis was again inspected and hemostasis confirmed. Laparotomy equipment was removed. The anterior peritoneum was closed with 2-0 chromic and needle counts and sponge counts correct. She was closed with running 0 Vicryl. Tears subcutaneous fatty tissues were reapproximated  with 2 layers of interrupted horizontal mattress sutures of 20 plain followed by subcuticular 4-0 Vicryl closure of the skin, using a Lanny Hurst needle Steri-Strips were applied and patient to recovery room in stable condition with EBL of 200 cc measured.  patient to recovery room in stable condition operative findings and procedure performed discussed with family as per patient instructions

## 2016-07-06 NOTE — Anesthesia Preprocedure Evaluation (Signed)
Anesthesia Evaluation  Patient identified by MRN, date of birth, ID band  Airway Mallampati: I  TM Distance: >3 FB Neck ROM: Full    Dental  (+) Teeth Intact   Pulmonary    Pulmonary exam normal        Cardiovascular hypertension, Normal cardiovascular exam Rhythm:Regular Rate:Normal     Neuro/Psych    GI/Hepatic GERD  Medicated,Irritable Bowel Syndrome   Endo/Other    Renal/GU      Musculoskeletal   Abdominal Normal abdominal exam  (+)   Peds  Hematology  (+) anemia ,   Anesthesia Other Findings Polyps  Reproductive/Obstetrics                             Anesthesia Physical Anesthesia Plan  ASA: II  Anesthesia Plan: General   Post-op Pain Management:    Induction: Intravenous  PONV Risk Score and Plan:   Airway Management Planned: Oral ETT  Additional Equipment:   Intra-op Plan:   Post-operative Plan: Extubation in OR  Informed Consent: I have reviewed the patients History and Physical, chart, labs and discussed the procedure including the risks, benefits and alternatives for the proposed anesthesia with the patient or authorized representative who has indicated his/her understanding and acceptance.     Plan Discussed with: CRNA  Anesthesia Plan Comments:         Anesthesia Quick Evaluation

## 2016-07-06 NOTE — Transfer of Care (Signed)
Immediate Anesthesia Transfer of Care Note  Patient: Christine Kidd  Procedure(s) Performed: Procedure(s): HYSTERECTOMY SUPRACERVICAL ABDOMINAL (N/A) BILATERAL SALPINGECTOMY (Bilateral) RIGHT OOPHORECTOMY (Right)  Patient Location: PACU  Anesthesia Type:General  Level of Consciousness: drowsy and patient cooperative  Airway & Oxygen Therapy: Patient Spontanous Breathing and Patient connected to face mask oxygen  Post-op Assessment: Report given to RN and Post -op Vital signs reviewed and stable  Post vital signs: Reviewed and stable  Last Vitals:  Vitals:   07/06/16 0637 07/06/16 0640  BP:  (!) 136/97  Temp: 36.9 C     Last Pain:  Vitals:   07/06/16 0637  TempSrc: Oral      Patients Stated Pain Goal: 8 (92/49/32 [id number])  Complications: No apparent anesthesia complications

## 2016-07-06 NOTE — Anesthesia Postprocedure Evaluation (Signed)
Anesthesia Post Note  Patient: Christine Kidd  Procedure(s) Performed: Procedure(s) (LRB): HYSTERECTOMY SUPRACERVICAL ABDOMINAL (N/A) BILATERAL SALPINGECTOMY (Bilateral) RIGHT OOPHORECTOMY (Right)  Patient location during evaluation: PACU Anesthesia Type: General Level of consciousness: awake and alert and oriented Pain management: pain level controlled Vital Signs Assessment: post-procedure vital signs reviewed and stable Respiratory status: spontaneous breathing Cardiovascular status: stable Postop Assessment: no signs of nausea or vomiting Anesthetic complications: no     Last Vitals:  Vitals:   07/06/16 0640 07/06/16 1008  BP: (!) 136/97 125/81  Pulse:  84  Resp:  17  Temp:  36.6 C    Last Pain:  Vitals:   07/06/16 0637  TempSrc: Oral                 ADAMS, AMY A

## 2016-07-06 NOTE — Interval H&P Note (Signed)
History and Physical Interval Note:  07/06/2016 7:26 AM  Christine Kidd  has presented today for surgery, with the diagnosis of Symptomatic Uterine Fibroids Endometrial Polyp Menorrhagia  The various methods of treatment have been discussed with the patient and family. After consideration of risks, benefits and other options for treatment, the patient has consented to  Procedure(s): HYSTERECTOMY SUPRACERVICAL ABDOMINAL (N/A) BILATERAL SALPINGECTOMY (Bilateral)  With BILATERAL SALPINGECTOMYas a surgical intervention .  The patient's history has been reviewed, patient examined, no change in status, stable for surgery.  I have reviewed the patient's chart and labs.  Questions were answered to the patient's satisfaction.    WE REVIEWED THE entire process of deciding on surgery type with the patient and her family members as per her request. Jonnie Kind

## 2016-07-07 LAB — CBC
HEMATOCRIT: 35 % — AB (ref 36.0–46.0)
HEMOGLOBIN: 11.4 g/dL — AB (ref 12.0–15.0)
MCH: 30.2 pg (ref 26.0–34.0)
MCHC: 32.6 g/dL (ref 30.0–36.0)
MCV: 92.6 fL (ref 78.0–100.0)
Platelets: 245 10*3/uL (ref 150–400)
RBC: 3.78 MIL/uL — AB (ref 3.87–5.11)
RDW: 12.7 % (ref 11.5–15.5)
WBC: 10.2 10*3/uL (ref 4.0–10.5)

## 2016-07-07 LAB — BASIC METABOLIC PANEL
Anion gap: 4 — ABNORMAL LOW (ref 5–15)
BUN: 9 mg/dL (ref 6–20)
CHLORIDE: 104 mmol/L (ref 101–111)
CO2: 28 mmol/L (ref 22–32)
CREATININE: 0.88 mg/dL (ref 0.44–1.00)
Calcium: 8.2 mg/dL — ABNORMAL LOW (ref 8.9–10.3)
GFR calc Af Amer: >60 mL/min (ref >60)
GFR calc non Af Amer: >60 mL/min (ref >60)
Glucose, Bld: 105 mg/dL — ABNORMAL HIGH (ref 65–99)
POTASSIUM: 4.4 mmol/L (ref 3.5–5.1)
Sodium: 136 mmol/L (ref 135–145)

## 2016-07-07 MED ORDER — OXYCODONE-ACETAMINOPHEN 5-325 MG PO TABS: 1.0000 | ORAL_TABLET | ORAL | 0 refills | Status: DC | PRN

## 2016-07-07 MED ORDER — DOCUSATE SODIUM 100 MG PO CAPS: 100.0000 mg | ORAL_CAPSULE | Freq: Two times a day (BID) | ORAL | 2 refills | Status: DC | PRN

## 2016-07-07 NOTE — Progress Notes (Signed)
Patient ambulated in room and 1 lap around nurses station. No c/o pain, SOB, nausea or dizziness noted. ABD incision drsg intact & dry with no new bleeding noted.

## 2016-07-07 NOTE — Discharge Instructions (Signed)
Abdominal Hysterectomy °Abdominal hysterectomy is a surgical procedure to remove the womb (uterus). The uterus is the muscular organ that houses a developing baby. This surgery may be done if: °· You have cancer. °· You have growths (tumors or fibroids) in the uterus. °· You have long-term (chronic) pain. °· You are bleeding. °· Your uterus has slipped down into your vagina (uterine prolapse). °· You have a condition in which the tissue that lines the uterus grows outside of its normal location (endometriosis). °· You have an infection in your uterus. °· You are having problems with your menstrual cycle. ° °Depending on why you are having this procedure, you may also have other reproductive organs removed. These could include: °· The part of your vagina that connects with your uterus (cervix). °· The organs that make eggs (ovaries). °· The tubes that connect the ovaries to the uterus (fallopian tubes). ° °Tell a health care provider about: °· Any allergies you have. °· All medicines you are taking, including vitamins, herbs, eye drops, creams, and over-the-counter medicines. °· Any problems you or family members have had with anesthetic medicines. °· Any blood disorders you have. °· Any surgeries you have had. °· Any medical conditions you have. °· Whether you are pregnant or may be pregnant. °What are the risks? °Generally, this is a safe procedure. However, problems may occur, including: °· Bleeding. °· Infection. °· Allergic reactions to medicines or dyes. °· Damage to other structures or organs. °· Nerve injury. °· Decreased interest in sex or pain during sex. °· Blood clots that can break free and travel to your lungs. ° °What happens before the procedure? °Staying hydrated °Follow instructions from your health care provider about hydration, which may include: °· Up to 2 hours before the procedure - you may continue to drink clear liquids, such as water, clear fruit juice, black coffee, and plain tea ° °Eating  and drinking restrictions °Follow instructions from your health care provider about eating and drinking, which may include: °· 8 hours before the procedure - stop eating heavy meals or foods such as meat, fried foods, or fatty foods. °· 6 hours before the procedure - stop eating light meals or foods, such as toast or cereal. °· 6 hours before the procedure - stop drinking milk or drinks that contain milk. °· 2 hours before the procedure - stop drinking clear liquids. ° °Medicines °· Ask your health care provider about: °? Changing or stopping your regular medicines. This is especially important if you are taking diabetes medicines or blood thinners. °? Taking medicines such as aspirin and ibuprofen. These medicines can thin your blood. Do not take these medicines before your procedure if your health care provider instructs you not to. °· You may be given antibiotic medicine to help prevent infection. Take it as told by your health care provider. °· You may be asked to take laxatives to prevent constipation. °General instructions °· Ask your health care provider how your surgical site will be marked or identified. °· You may be asked to shower with a germ-killing soap. °· Plan to have someone take you home from the hospital. °· Do not use any products that contain nicotine or tobacco, such as cigarettes and e-cigarettes. If you need help quitting, ask your health care provider. °· You may have an exam or testing. °· You may have a blood or urine sample taken. °· You may need to have an enema to clean out your rectum and lower colon. °· This   procedure can affect the way you feel about yourself. Talk to your health care provider about the physical and emotional changes this procedure may cause. °What happens during the procedure? °· To lower your risk of infection: °? Your health care team will wash or sanitize their hands. °? Your skin will be washed with soap. °? Hair may be removed from the surgical area. °· An IV  tube will be inserted into one of your veins. °· You will be given one or more of the following: °? A medicine to help you relax (sedative). °? A medicine to make you fall asleep (general anesthetic). °· Tight-fitting (compression) stockings will be placed on your legs to promote circulation. °· A thin, flexible tube (catheter) will be inserted to help drain your urine. °· The surgeon will make a cut (incision) through the skin in your lower belly. The incision may go side-to-side or up-and-down. °· The surgeon will move aside the body tissue that covers your uterus. The surgeon will then carefully take out your uterus along with any of the other organs that need to be removed. °· Bleeding will be controlled with clamps or sutures. °· The surgeon will close your incision with stitches (sutures), skin glue, or adhesive strips. °· A bandage (dressing) will be placed over the incision. °The procedure may vary among health care providers and hospitals. °What happens after the procedure? °· You will be given pain medicine as needed. °· Your blood pressure, heart rate, breathing rate, and blood oxygen level will be monitored until the medicines you were given have worn off. °· You will need to stay in the hospital to recover for one to two days. Ask your health care provider how long you will need to stay in the hospital after your procedure. °· You may have a liquid diet at first. You will most likely return to your usual diet the day after surgery. °· You will still have the urinary catheter in place. It will likely be removed the day after surgery. °· You may have to wear compression stockings. These stockings help to prevent blood clots and reduce swelling in your legs. °· You will be encouraged to walk as soon as possible. You will also use a device or do breathing exercises to keep your lungs clear. °· You may need to use a sanitary napkin for vaginal discharge. °Summary °· Abdominal hysterectomy is a surgical  procedure to remove the womb (uterus). The uterus is the muscular organ that houses a developing baby. °· This procedure can affect the way you feel about yourself. Talk to your health care provider about the physical and emotional changes this procedure may cause. °· You will be given medicines for pain after the procedure. °· You will need to stay in the hospital to recover. Ask your health care provider how long you will need to stay in the hospital after your procedure. °This information is not intended to replace advice given to you by your health care provider. Make sure you discuss any questions you have with your health care provider. °Document Released: 01/02/2013 Document Revised: 12/17/2015 Document Reviewed: 12/17/2015 °Elsevier Interactive Patient Education © 2017 [employer] ° °

## 2016-07-07 NOTE — Progress Notes (Signed)
1 Day Post-Op Procedure(s) (LRB): HYSTERECTOMY SUPRACERVICAL ABDOMINAL (N/A) BILATERAL SALPINGECTOMY (Bilateral) RIGHT OOPHORECTOMY (Right)  Subjective: Patient reports incisional pain, tolerating PO and no problems voiding.   Bowel sounds active Objective: I have reviewed patient's vital signs, medications and labs. CBC Latest Ref Rng & Units 07/07/2016 07/01/2016 12/09/2014  WBC 4.0 - 10.5 K/uL 10.2 7.0 8.6  Hemoglobin 12.0 - 15.0 g/dL 11.4(L) 12.5 14.0  Hematocrit 36.0 - 46.0 % 35.0(L) 37.8 41.9  Platelets 150 - 400 K/uL 245 260 243   BMP Latest Ref Rng & Units 07/07/2016 07/01/2016 10/08/2013  Glucose 65 - 99 mg/dL 105(H) 90 77  BUN 6 - 20 mg/dL 9 12 16   Creatinine 0.44 - 1.00 mg/dL 0.88 0.78 1.05  Sodium 135 - 145 mmol/L 136 137 135  Potassium 3.5 - 5.1 mmol/L 4.4 4.1 4.5  Chloride 101 - 111 mmol/L 104 102 97  CO2 22 - 32 mmol/L 28 28 26   Calcium 8.9 - 10.3 mg/dL 8.2(L) 8.9 10.2    General: alert, cooperative and no distress GI: soft, non-tender; bowel sounds normal; no masses,  no organomegaly Extremities: Homans sign is negative, no sign of DVT  Assessment: s/p Procedure(s): HYSTERECTOMY SUPRACERVICAL ABDOMINAL (N/A) BILATERAL SALPINGECTOMY (Bilateral) RIGHT OOPHORECTOMY (Right): stable, progressing well and tolerating diet  Plan: Advance diet Discontinue IV fluids pt may wish to go home at lunch if tolerates ambulation  LOS: 1 day    Rashid Whitenight V 07/07/2016, 8:38 AM

## 2016-07-07 NOTE — Care Management Note (Signed)
Case Management Note  Patient Details  Name: Christine Kidd MRN: [id number] Date of Birth: 1970-08-10   Subjective/Objective:                  S/p abd hysterectomy. Chart reviewed for CM needs, has follow up provider,  Insurance with drug coverage. No f/u needs identified.  Action/Plan: DC home today? No CM needs.   Expected Discharge Date:  07/07/16               Expected Discharge Plan:  Home/Self Care  In-House Referral:  NA  Discharge planning Services  CM Consult  Post Acute Care Choice:  NA Choice offered to:  NA  Status of Service:  Completed, signed off   Sherald Barge, RN 07/07/2016, 9:47 AM

## 2016-07-07 NOTE — Progress Notes (Signed)
D/c instructions, paperwork and hard Rx given to patient and patient's husband. IV catheter removed from RIGHT hand, intact w/no s/s of infection/infiltration noted at this time. Post Sx instructions given to patient and patient aware to F/U with Dr.Ferguson on July 2nd as scheduled. Patient transporting home with husband.

## 2016-07-07 NOTE — Addendum Note (Signed)
Addendum  created 07/07/16 0916 by Vista Deck, CRNA   Sign clinical note

## 2016-07-07 NOTE — Anesthesia Postprocedure Evaluation (Signed)
Anesthesia Post Note  Patient: Mackinzee P Aufiero  Procedure(s) Performed: Procedure(s) (LRB): HYSTERECTOMY SUPRACERVICAL ABDOMINAL (N/A) BILATERAL SALPINGECTOMY (Bilateral) RIGHT OOPHORECTOMY (Right)  Patient location during evaluation: Nursing Unit Anesthesia Type: General Level of consciousness: awake and alert Pain management: satisfactory to patient Vital Signs Assessment: post-procedure vital signs reviewed and stable Respiratory status: spontaneous breathing Cardiovascular status: stable Postop Assessment: no signs of nausea or vomiting Anesthetic complications: no     Last Vitals:  Vitals:   07/06/16 2100 07/07/16 0250  BP: 113/60 (!) 92/51  Pulse: (!) 107 86  Resp: 16 18  Temp: 37.4 C 37.1 C    Last Pain:  Vitals:   07/07/16 0250  TempSrc: Oral  PainSc:                  Drucie Opitz

## 2016-07-07 NOTE — Discharge Summary (Signed)
Physician Discharge Summary  Patient ID: Christine Kidd MRN: [id number] DOB/AGE: Sep 20, 1970 46 y.o.  Admit date: 07/06/2016 Discharge date: 07/07/2016  Admission Diagnoses:Uterine fibroids symptomatic and  endometrial polyp  menorrhagia with regular cycle  Discharge Diagnoses:  Active Problems:   Uterine fibroid   Endometrial polyp   Menorrhagia with regular cycle   Status post abdominal supracervical subtotal hysterectomy   Discharged Condition: good  Hospital Course: Tangee P Cannonis a 46 y.o.here for surgical management of uterine fibroids, endometrial polyp, and menorrhagia. No significant preoperative concerns. She was admitted through day surgery and underwent abdominal hysterectomy supracervical, bilateral salpingectomy. Surgery was complicated by bleeding from the large vessels in the right adnexa which compromised blood flow to the right ovary so right oophorectomy was necessary. Procedure was otherwise uncomplicated with a 409 cc blood loss. Stop her course was uneventful with laboratory results showing stable condition on postop day 1, and patient tolerating diet sufficiently well to go home on POD 1  Consults: None  Significant Diagnostic Studies: labs:  CBC Latest Ref Rng & Units 07/07/2016 07/01/2016 12/09/2014  WBC 4.0 - 10.5 K/uL 10.2 7.0 8.6  Hemoglobin 12.0 - 15.0 g/dL 11.4(L) 12.5 14.0  Hematocrit 36.0 - 46.0 % 35.0(L) 37.8 41.9  Platelets 150 - 400 K/uL 245 260 243   CMP Latest Ref Rng & Units 07/07/2016 07/01/2016 10/08/2013  Glucose 65 - 99 mg/dL 105(H) 90 77  BUN 6 - 20 mg/dL 9 12 16   Creatinine 0.44 - 1.00 mg/dL 0.88 0.78 1.05  Sodium 135 - 145 mmol/L 136 137 135  Potassium 3.5 - 5.1 mmol/L 4.4 4.1 4.5  Chloride 101 - 111 mmol/L 104 102 97  CO2 22 - 32 mmol/L 28 28 26   Calcium 8.9 - 10.3 mg/dL 8.2(L) 8.9 10.2  Total Protein 6.5 - 8.1 g/dL - 6.6 8.0  Total Bilirubin 0.3 - 1.2 mg/dL - 0.5 0.6  Alkaline Phos 38 - 126 U/L - 30(L) 41  AST 15 - 41  U/L - 17 27  ALT 14 - 54 U/L - 14 26     Treatments: surgery: Abdominal supracervical hysterectomy, bilateral salpingectomy, right oophorectomy  Discharge Exam: Blood pressure (!) 92/51, pulse 86, temperature 98.7 F (37.1 C), temperature source Oral, resp. rate 18, height 5\' 3"  (1.6 m), weight 149 lb (67.6 kg), last menstrual period 06/30/2016, SpO2 100 %. General appearance: alert, cooperative and no distress Head: Normocephalic, without obvious abnormality, atraumatic GI: soft, non-tender; bowel sounds normal; no masses,  no organomegaly Extremities: extremities normal, atraumatic, no cyanosis or edema and Homans sign is negative, no sign of DVT Incision/Wound: Clean and intact with dressing in place  Disposition: 01-Home or Self Care  Discharge Instructions    Call MD for:  persistant nausea and vomiting    Complete by:  As directed    Call MD for:  severe uncontrolled pain    Complete by:  As directed    Call MD for:  temperature >100.4    Complete by:  As directed    Diet - low sodium heart healthy    Complete by:  As directed    Increase activity slowly    Complete by:  As directed      Allergies as of 07/07/2016   No Known Allergies     Medication List    STOP taking these medications   aspirin 81 MG tablet     TAKE these medications   BENEFIBER DRINK MIX PO Take by mouth 2 (two) times daily  as needed (constipated). Teaspoonful   cetirizine 10 MG tablet Commonly known as:  ZYRTEC Take 10 mg by mouth daily as needed for allergies.   dicyclomine 10 MG capsule Commonly known as:  BENTYL Take 1 capsule (10 mg total) by mouth 3 (three) times daily. What changed:  when to take this  reasons to take this   docusate sodium 100 MG capsule Commonly known as:  COLACE Take 1 capsule (100 mg total) by mouth 2 (two) times daily as needed.   ferrous sulfate 325 (65 FE) MG EC tablet Take 325 mg by mouth 3 (three) times daily with meals.   omeprazole 40 MG  capsule Commonly known as:  PRILOSEC Take 1 capsule (40 mg total) by mouth daily. What changed:  when to take this  reasons to take this   oxyCODONE-acetaminophen 5-325 MG tablet Commonly known as:  ROXICET Take 1 tablet by mouth every 6 (six) hours as needed. What changed:  Another medication with the same name was added. Make sure you understand how and when to take each.   oxyCODONE-acetaminophen 5-325 MG tablet Commonly known as:  PERCOCET/ROXICET Take 1-2 tablets by mouth every 4 (four) hours as needed for severe pain (moderate to severe pain (when tolerating fluids)). What changed:  You were already taking a medication with the same name, and this prescription was added. Make sure you understand how and when to take each.   PHILLIPS COLON HEALTH Caps Take 1 capsule by mouth daily as needed (IBS issues).   triamterene-hydrochlorothiazide 37.5-25 MG tablet Commonly known as:  MAXZIDE-25 TAKE 1 TABLET BY MOUTH EVERY DAY      Follow-up Information    Jonnie Kind, MD Follow up in 1 week(s).   Specialties:  Obstetrics and Gynecology, Radiology Contact information: [place] [address] [phone]           Signed: Jonnie Kind 07/07/2016, 12:49 PM

## 2016-07-08 ENCOUNTER — Encounter (HOSPITAL_COMMUNITY): Payer: Self-pay | Admitting: Obstetrics and Gynecology

## 2016-07-12 ENCOUNTER — Encounter: Payer: Self-pay | Admitting: Obstetrics and Gynecology

## 2016-07-12 ENCOUNTER — Ambulatory Visit (INDEPENDENT_AMBULATORY_CARE_PROVIDER_SITE_OTHER): Payer: BC Managed Care – PPO | Admitting: Obstetrics and Gynecology

## 2016-07-12 VITALS — BP 104/60 | HR 72 | Wt 143.2 lb

## 2016-07-12 DIAGNOSIS — Z9889 Other specified postprocedural states: Secondary | ICD-10-CM

## 2016-07-12 DIAGNOSIS — Z09 Encounter for follow-up examination after completed treatment for conditions other than malignant neoplasm: Secondary | ICD-10-CM

## 2016-07-12 DIAGNOSIS — Z90711 Acquired absence of uterus with remaining cervical stump: Secondary | ICD-10-CM

## 2016-07-12 NOTE — Progress Notes (Signed)
   Subjective:  Christine Kidd is a 46 y.o. female now 1 week status post supracervical hysterectomy, right oophorectomy and bilateral salpingectomy.    Dressing is been kept in place the patient's ambulatory and appears well Review of Systems Negative   Diet:   Normal appetite   Bowel movements : normal.  Pain is controlled with Percocet medication.  Objective:  BP 104/60 (BP Location: Right Arm, Patient Position: Sitting, Cuff Size: Normal)   Pulse 72   Wt 143 lb 3.2 oz (65 kg)   LMP 06/30/2016   BMI 25.37 kg/m  General:Well developed, well nourished.  No acute distress. Abdomen: Bowel sounds normal, soft, mild puffiness around incision site, no erythema at siteSteri-Strips in place. Honeycomb dressing is removed Pelvic Exam: not indicated  Incision(s):   Healing well, no drainage, no erythema, no hernia, no swelling, no dehiscence,     Assessment:  Post-Op 1 weeks s/p supracervical hysterectomy, right oophorectomy and bilateral salpingectomy    Pt doing well postoperatively.   Plan:  1.Wound care discussed  2. current medications BP med with diuretic, and percocet 3. Activity restrictions: no driving for 2 weeks, no lifting anything over 10 lbs 4. return to work: 6 weekds. 5. Follow up in 4 weeks.   By signing my name below, I, Evelene Croon, attest that this documentation has been prepared under the direction and in the presence of Jonnie Kind, MD . Electronically Signed: Evelene Croon, Scribe. 07/12/2016. 10:51 AM. I personally performed the services described in this documentation, which was SCRIBED in my presence. The recorded information has been reviewed and considered accurate. It has been edited as necessary during review. Jonnie Kind, MD

## 2016-07-20 ENCOUNTER — Encounter: Payer: Self-pay | Admitting: Obstetrics and Gynecology

## 2016-08-11 ENCOUNTER — Ambulatory Visit (INDEPENDENT_AMBULATORY_CARE_PROVIDER_SITE_OTHER): Payer: BC Managed Care – PPO | Admitting: Obstetrics and Gynecology

## 2016-08-11 ENCOUNTER — Encounter: Payer: Self-pay | Admitting: Obstetrics and Gynecology

## 2016-08-11 VITALS — BP 116/70 | HR 81 | Ht 63.0 in | Wt 145.6 lb

## 2016-08-11 DIAGNOSIS — Z9889 Other specified postprocedural states: Secondary | ICD-10-CM

## 2016-08-11 DIAGNOSIS — N898 Other specified noninflammatory disorders of vagina: Secondary | ICD-10-CM

## 2016-08-11 LAB — POCT WET PREP (WET MOUNT): Trichomonas Wet Prep HPF POC: ABSENT

## 2016-08-11 NOTE — Progress Notes (Signed)
Patient ID: Christine Kidd, female   DOB: March 14, 1970, 46 y.o.   MRN: [id number]    Subjective:  Christine Kidd is a 46 y.o. female now 5 weeks status post supracervical hysterectomy. No complaints. She is a Education officer, museum. Pt denies any vaginal medications. She reports minor vaginal itchiness, pt thought it was due to soap.   Review of Systems Negative except    Diet:   negative   Bowel movements : normal.  The patient is not having any pain.  Objective:  BP 116/70 (BP Location: Right Arm, Patient Position: Sitting, Cuff Size: Normal)   Pulse 81   Ht 5\' 3"  (1.6 m)   Wt 145 lb 9.6 oz (66 kg)   LMP 06/30/2016   BMI 25.79 kg/m  General:Well developed, well nourished.  No acute distress. Abdomen: Bowel sounds normal, soft, non-tender. Pelvic Exam:    External Genitalia:  Normal.    Vagina: Normal    Cervix: Normal    Uterus: surgically absent, vaginal cuff well healed    Adnexa/Bimanual: negative  WET MOUNT done - results: KOH done, No trich, no clue, positive epithelial cells  Incision(s):   Healing well, no drainage, no erythema, no hernia, no swelling, no dehiscence,     Assessment:  Post-Op 5 weeks s/p supracervical hysterectomy  Doing well postoperatively.   Plan:  1.Wound care discussed  2. . current medications. 3. Activity restrictions: desk job only 4. return to work: 1-2 weeks. 5. Follow up in PRN.   By signing my name below, I, Margit Banda, attest that this documentation has been prepared under the direction and in the presence of Jonnie Kind, MD. Electronically Signed: Margit Banda, Medical Scribe. 08/11/16. 11:39 AM.  I personally performed the services described in this documentation, which was SCRIBED in my presence. The recorded information has been reviewed and considered accurate. It has been edited as necessary during review. Jonnie Kind, MD

## 2016-09-08 ENCOUNTER — Telehealth: Payer: Self-pay | Admitting: *Deleted

## 2016-09-08 ENCOUNTER — Encounter: Payer: BC Managed Care – PPO | Admitting: Obstetrics and Gynecology

## 2016-09-08 MED ORDER — FLUCONAZOLE 150 MG PO TABS: 150.0000 mg | ORAL_TABLET | Freq: Once | ORAL | 1 refills | Status: AC

## 2016-09-08 NOTE — Telephone Encounter (Signed)
Patient was unable to make her appointment today. White, chunky vaginal discharge, no odor, some itching. Has tried Monistat with relief but has returned. Patient was offered appointment on Friday but unable to come due to work. Please advise.

## 2016-09-08 NOTE — Telephone Encounter (Signed)
Diflucan escribed for pt.

## 2016-09-09 NOTE — Telephone Encounter (Signed)
LMOVM that Diflucan sent to pharmacy.

## 2016-10-08 ENCOUNTER — Encounter (INDEPENDENT_AMBULATORY_CARE_PROVIDER_SITE_OTHER): Payer: Self-pay | Admitting: Internal Medicine

## 2016-10-23 ENCOUNTER — Other Ambulatory Visit: Payer: Self-pay | Admitting: Obstetrics and Gynecology

## 2016-11-03 ENCOUNTER — Other Ambulatory Visit: Payer: Self-pay | Admitting: *Deleted

## 2016-11-04 MED ORDER — TRIAMTERENE-HCTZ 37.5-25 MG PO TABS: 1.0000 | ORAL_TABLET | Freq: Every day | ORAL | 2 refills | Status: DC

## 2016-12-07 NOTE — Telephone Encounter (Signed)
Request delayed in reaching inbasket. Taken care of thru other contacts with pt. 

## 2016-12-08 ENCOUNTER — Encounter (INDEPENDENT_AMBULATORY_CARE_PROVIDER_SITE_OTHER): Payer: Self-pay | Admitting: Internal Medicine

## 2016-12-08 ENCOUNTER — Ambulatory Visit (INDEPENDENT_AMBULATORY_CARE_PROVIDER_SITE_OTHER): Payer: BC Managed Care – PPO | Admitting: Internal Medicine

## 2016-12-08 VITALS — BP 110/70 | HR 76 | Temp 98.1°F | Ht 63.0 in | Wt 144.0 lb

## 2016-12-08 DIAGNOSIS — K580 Irritable bowel syndrome with diarrhea: Secondary | ICD-10-CM

## 2016-12-08 NOTE — Progress Notes (Signed)
Subjective:    Patient ID: Christine Kidd, female    DOB: [birth year], 46 y.o.   MRN: [id number]  HPI Here today for f/u. Last seen in November of 2017. Hx of IBS Takes Dicyclomine TID. Takes Goody Powder's as needed .  Her appetite is good. No weight loss. She has gained 5 pounds since her last visit.  Has a BM about 3-4 times a day. Stools are soft with no straining. Occasionally has seen some blood on the toilet tissue.  Dicyclomine helps with the abdominal cramps. Recently underwent a hysterectomy in June, for symptomatic. uterine fibroids .    11/201/2014 Colonoscopy: Intermittent diarrhea and rectal bleeding.  Examination performed to cecum. Single small submucosal lipoma ascending colon.  Single small diverticulum at hepatic flexure. Distal rectal ulcer proximal to dentate line. I see taken for routine histology. Internal/external hemorrhoids.  Review of Systems Past Medical History:  Diagnosis Date  . Anemia   . GERD (gastroesophageal reflux disease)   . Hypertension    x 14 yrs  . Rectal bleeding     Past Surgical History:  Procedure Laterality Date  . BILATERAL SALPINGECTOMY Bilateral 07/06/2016   Procedure: BILATERAL SALPINGECTOMY;  Surgeon: Jonnie Kind, MD;  Location: [facility];  Service: Gynecology;  Laterality: Bilateral;  . COLONOSCOPY N/A 11/30/2012   Procedure: COLONOSCOPY;  Surgeon: Rogene Houston, MD;  Location: [facility];  Service: Endoscopy;  Laterality: N/A;  830  . NO PAST SURGERIES    . OOPHORECTOMY Right 07/06/2016   Procedure: RIGHT OOPHORECTOMY;  Surgeon: Jonnie Kind, MD;  Location: [facility];  Service: Gynecology;  Laterality: Right;  . SUPRACERVICAL ABDOMINAL HYSTERECTOMY N/A 07/06/2016   Procedure: HYSTERECTOMY SUPRACERVICAL ABDOMINAL;  Surgeon: Jonnie Kind, MD;  Location: [facility];  Service: Gynecology;  Laterality: N/A;    No Known Allergies  Current Outpatient Medications on File Prior to Visit  Medication Sig Dispense Refill    . aspirin EC 81 MG tablet Take 81 mg by mouth daily.    . cetirizine (ZYRTEC) 10 MG tablet Take 10 mg by mouth daily as needed for allergies.    Marland Kitchen dicyclomine (BENTYL) 10 MG capsule Take 1 capsule (10 mg total) by mouth 3 (three) times daily. (Patient taking differently: Take 10 mg by mouth 3 (three) times daily as needed for spasms. ) 90 capsule 4  . docusate sodium (COLACE) 100 MG capsule Take 1 capsule (100 mg total) by mouth 2 (two) times daily as needed. 30 capsule 2  . ferrous sulfate 325 (65 FE) MG EC tablet Take 325 mg by mouth 3 (three) times daily with meals.    Marland Kitchen omeprazole (PRILOSEC) 40 MG capsule Take 1 capsule (40 mg total) by mouth daily. (Patient taking differently: Take 40 mg by mouth daily as needed (acid reflux). ) 90 capsule 3  . Probiotic Product ([place]) CAPS Take 1 capsule by mouth daily as needed (IBS issues).     . triamterene-hydrochlorothiazide (MAXZIDE-25) 37.5-25 MG tablet Take 1 tablet by mouth daily. 90 tablet 2  . Wheat Dextrin (BENEFIBER DRINK MIX PO) Take by mouth 2 (two) times daily as needed (constipated). Teaspoonful     No current facility-administered medications on file prior to visit.         Objective:   Physical Exam Blood pressure 110/70, pulse 76, temperature 98.1 F (36.7 C), height 5\' 3"  (1.6 m), weight 144 lb (65.3 kg), last menstrual period 06/30/2016. Alert and oriented. Skin warm and dry. Oral  mucosa is moist.   . Sclera anicteric, conjunctivae is pink. Thyroid not enlarged. No cervical lymphadenopathy. Lungs clear. Heart regular rate and rhythm.  Abdomen is soft. Bowel sounds are positive. No hepatomegaly. No abdominal masses felt. No tenderness.  No edema to lower extremities.           Assessment & Plan:  IBS. She is doing well. She will continue the Dicyclomine TID. OV in 1 yrs.

## 2016-12-08 NOTE — Patient Instructions (Signed)
Continue the Dicyclomine. OV in 1 year. 

## 2017-01-03 ENCOUNTER — Encounter: Payer: Self-pay | Admitting: Family Medicine

## 2017-01-03 ENCOUNTER — Ambulatory Visit (INDEPENDENT_AMBULATORY_CARE_PROVIDER_SITE_OTHER): Payer: BC Managed Care – PPO | Admitting: Family Medicine

## 2017-01-03 ENCOUNTER — Ambulatory Visit: Payer: BC Managed Care – PPO | Admitting: Family Medicine

## 2017-01-03 ENCOUNTER — Other Ambulatory Visit: Payer: Self-pay

## 2017-01-03 VITALS — BP 128/82 | HR 76 | Temp 97.7°F | Resp 16 | Ht 63.0 in | Wt 145.0 lb

## 2017-01-03 DIAGNOSIS — I10 Essential (primary) hypertension: Secondary | ICD-10-CM | POA: Diagnosis not present

## 2017-01-03 DIAGNOSIS — Z9109 Other allergy status, other than to drugs and biological substances: Secondary | ICD-10-CM | POA: Diagnosis not present

## 2017-01-03 DIAGNOSIS — K582 Mixed irritable bowel syndrome: Secondary | ICD-10-CM

## 2017-01-03 NOTE — Patient Instructions (Signed)
Continue to eat well Try to get back into the habit of exercise  Need old records  See me once a year  Call sooner for problems

## 2017-01-03 NOTE — Progress Notes (Signed)
Chief Complaint  Patient presents with  . Hypertension   Christine Kidd is a new patient to the office.  Her prior doctor left the practice.  She is under care for hypertension.  She also sees a gastroenterologist for GERD and irritable bowel syndrome.  She recently had a hysterectomy for endometriosis. She works as a Optometrist.  She is married with 2 children at home.  She states she is quite busy is a working mother, and had does not make time to exercise. Her health maintenance is up-to-date.  Pap and mammogram have been done.  She had a colonoscopy a couple years ago for rectal bleeding. She states her immunizations are up-to-date per the school system.  She is uncertain of her last tetanus shot.  She will try to get me this record. She has environmental allergies controlled with Zyrtec.  No drug allergies. She takes her probiotic and Benefiber daily, Colace infrequently, Bentyl infrequently, and omeprazole as needed to control abdominal symptoms. He gets eye exams yearly.  She sees her dentist twice a year.  Patient Active Problem List   Diagnosis Date Noted  . Status post abdominal supracervical subtotal hysterectomy 07/06/2016  . IBS (irritable bowel syndrome) 04/03/2013  . GERD (gastroesophageal reflux disease) 01/16/2013  . Essential hypertension, benign 11/20/2012    Outpatient Encounter Medications as of 01/03/2017  Medication Sig  . aspirin EC 81 MG tablet Take 81 mg by mouth daily.  . cetirizine (ZYRTEC) 10 MG tablet Take 10 mg by mouth daily as needed for allergies.  Marland Kitchen dicyclomine (BENTYL) 10 MG capsule Take 1 capsule (10 mg total) by mouth 3 (three) times daily. (Patient taking differently: Take 10 mg by mouth 3 (three) times daily as needed for spasms. )  . docusate sodium (COLACE) 100 MG capsule Take 1 capsule (100 mg total) by mouth 2 (two) times daily as needed.  . ferrous sulfate 325 (65 FE) MG EC tablet Take 325 mg by mouth 3 (three) times daily with meals.    Marland Kitchen omeprazole (PRILOSEC) 40 MG capsule Take 1 capsule (40 mg total) by mouth daily. (Patient taking differently: Take 40 mg by mouth daily as needed (acid reflux). )  . Probiotic Product ([place]) CAPS Take 1 capsule by mouth daily as needed (IBS issues).   . triamterene-hydrochlorothiazide (MAXZIDE-25) 37.5-25 MG tablet Take 1 tablet by mouth daily.  . Wheat Dextrin (BENEFIBER DRINK MIX PO) Take by mouth 2 (two) times daily as needed (constipated). Teaspoonful   No facility-administered encounter medications on file as of 01/03/2017.     Past Medical History:  Diagnosis Date  . Anemia   . Blood transfusion without reported diagnosis   . GERD (gastroesophageal reflux disease)   . Hypertension    x 14 yrs  . Rectal bleeding     Past Surgical History:  Procedure Laterality Date  . ABDOMINAL HYSTERECTOMY    . BILATERAL SALPINGECTOMY Bilateral 07/06/2016   Procedure: BILATERAL SALPINGECTOMY;  Surgeon: Jonnie Kind, MD;  Location: [facility];  Service: Gynecology;  Laterality: Bilateral;  . COLONOSCOPY N/A 11/30/2012   Procedure: COLONOSCOPY;  Surgeon: Rogene Houston, MD;  Location: [facility];  Service: Endoscopy;  Laterality: N/A;  830  . NO PAST SURGERIES    . OOPHORECTOMY Right 07/06/2016   Procedure: RIGHT OOPHORECTOMY;  Surgeon: Jonnie Kind, MD;  Location: [facility];  Service: Gynecology;  Laterality: Right;  . SUPRACERVICAL ABDOMINAL HYSTERECTOMY N/A 07/06/2016   Procedure: HYSTERECTOMY SUPRACERVICAL ABDOMINAL;  Surgeon:  Jonnie Kind, MD;  Location: [facility];  Service: Gynecology;  Laterality: N/A;    Social History   Socioeconomic History  . Marital status: Married    Spouse name: steve  . Number of children: 2  . Years of education: 36  . Highest education level: Some college, no degree  Social Needs  . Financial resource strain: Not hard at all  . Food insecurity - worry: Never true  . Food insecurity - inability: Never true  . Transportation needs  - medical: No  . Transportation needs - non-medical: No  Occupational History  . Occupation: teach assist  Tobacco Use  . Smoking status: Never Smoker  . Smokeless tobacco: Never Used  Substance and Sexual Activity  . Alcohol use: No  . Drug use: No  . Sexual activity: Not Currently    Birth control/protection: Surgical    Comment: supracervical hyst  Other Topics Concern  . Not on file  Social History Narrative   Lives with husband and 2 sons   Older son in college   Younger son is 75    Family History  Problem Relation Age of Onset  . Other Mother        overdose  . Drug abuse Mother   . Early death Mother 32  . Hypertension Father   . Hyperlipidemia Father   . Cancer Paternal Grandmother        stomach  . Dementia Maternal Grandmother   . Cancer Paternal Aunt        breast  . Colon cancer Neg Hx     Review of Systems  Constitutional: Negative for chills, fever and weight loss.  HENT: Negative for congestion and hearing loss.   Eyes: Negative for blurred vision and pain.  Respiratory: Negative for cough and shortness of breath.   Cardiovascular: Negative for chest pain and leg swelling.  Gastrointestinal: Negative for abdominal pain, constipation, diarrhea and heartburn.  Genitourinary: Negative for dysuria and frequency.  Musculoskeletal: Negative for falls, joint pain and myalgias.  Neurological: Negative for dizziness, seizures and headaches.  Psychiatric/Behavioral: Negative for depression. The patient is not nervous/anxious and does not have insomnia.     BP 128/82 (BP Location: Right Arm, Patient Position: Sitting, Cuff Size: Normal)   Pulse 76   Temp 97.7 F (36.5 C) (Temporal)   Resp 16   Ht 5\' 3"  (1.6 m)   Wt 145 lb 0.6 oz (65.8 kg)   LMP 06/30/2016   SpO2 100%   BMI 25.69 kg/m   Physical Exam  Constitutional: She is oriented to person, place, and time. She appears well-developed and well-nourished.  HENT:  Head: Normocephalic and  atraumatic.  Right Ear: External ear normal.  Left Ear: External ear normal.  Mouth/Throat: Oropharynx is clear and moist.  Eyes: Conjunctivae are normal. Pupils are equal, round, and reactive to light.  Neck: Normal range of motion. Neck supple. No thyromegaly present.  Cardiovascular: Normal rate, regular rhythm and normal heart sounds.  Pulmonary/Chest: Effort normal and breath sounds normal. No respiratory distress.  Abdominal: Soft. Bowel sounds are normal.  Musculoskeletal: Normal range of motion. She exhibits no edema.  Lymphadenopathy:    She has no cervical adenopathy.  Neurological: She is alert and oriented to person, place, and time.  Gait normal  Skin: Skin is warm and dry.  Psychiatric: She has a normal mood and affect. Her behavior is normal. Thought content normal.  Nursing note and vitals reviewed. ASSESSMENT/PLAN:  Essential hypertension  Essential  hypertension, benign  Irritable bowel syndrome with both constipation and diarrhea  Environmental allergies    Patient Instructions  Continue to eat well Try to get back into the habit of exercise  Need old records  See me once a year  Call sooner for problems    Raylene Everts, MD

## 2017-01-07 ENCOUNTER — Telehealth: Payer: Self-pay | Admitting: Family Medicine

## 2017-01-07 NOTE — Telephone Encounter (Signed)
We have the medical record form signed, however  WE NEED to know who the PRIOR Dr was. Left msg for patient to call us to provide the information. Form is in the brown box

## 2017-05-20 ENCOUNTER — Ambulatory Visit (INDEPENDENT_AMBULATORY_CARE_PROVIDER_SITE_OTHER): Payer: Commercial Managed Care - PPO | Admitting: Internal Medicine

## 2017-05-20 ENCOUNTER — Encounter (INDEPENDENT_AMBULATORY_CARE_PROVIDER_SITE_OTHER): Payer: Self-pay | Admitting: Internal Medicine

## 2017-05-20 VITALS — BP 128/82 | HR 80 | Temp 99.0°F | Ht 63.0 in | Wt 142.0 lb

## 2017-05-20 DIAGNOSIS — K588 Other irritable bowel syndrome: Secondary | ICD-10-CM

## 2017-05-20 DIAGNOSIS — K648 Other hemorrhoids: Secondary | ICD-10-CM

## 2017-05-20 IMAGING — US US ABDOMEN COMPLETE
1 series · 13 of 25 positions shown · non-contrast
Comparison: Ultrasound of the pelvis of 11/26/2013

CLINICAL DATA: Lower abdominal palpable mass on physical exam 1
week ago, history of uterine fibroids

EXAM:
ULTRASOUND ABDOMEN COMPLETE

[Series 1: us abdomen complete · 0.15mm/px · 13 of 143 slices shown]
[im 1/143]
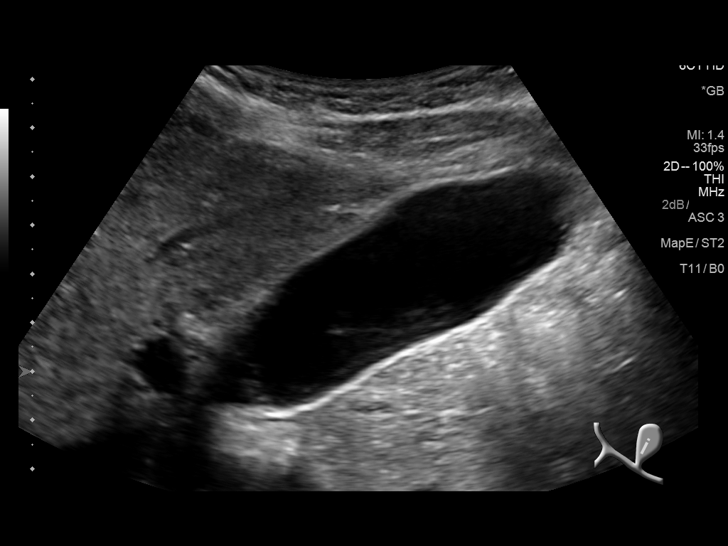
[im 12/143]
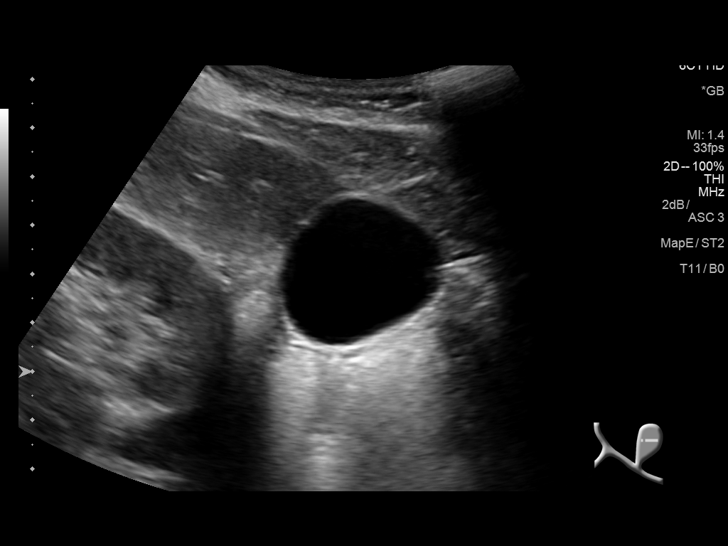
[im 24/143]
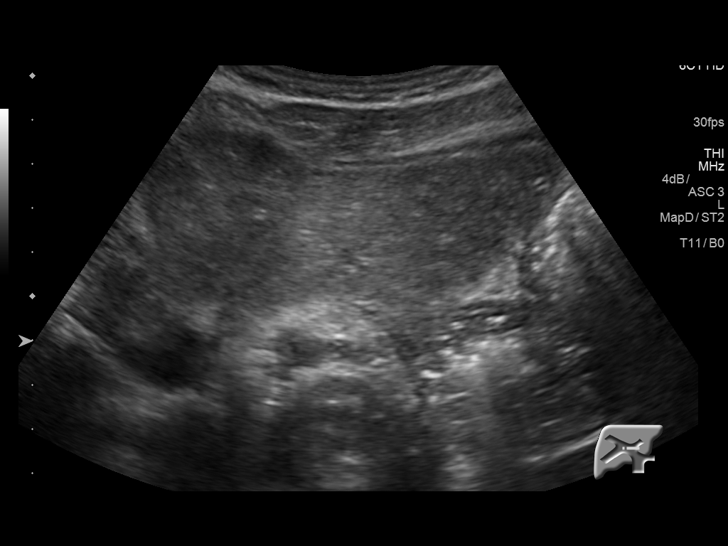
[im 36/143]
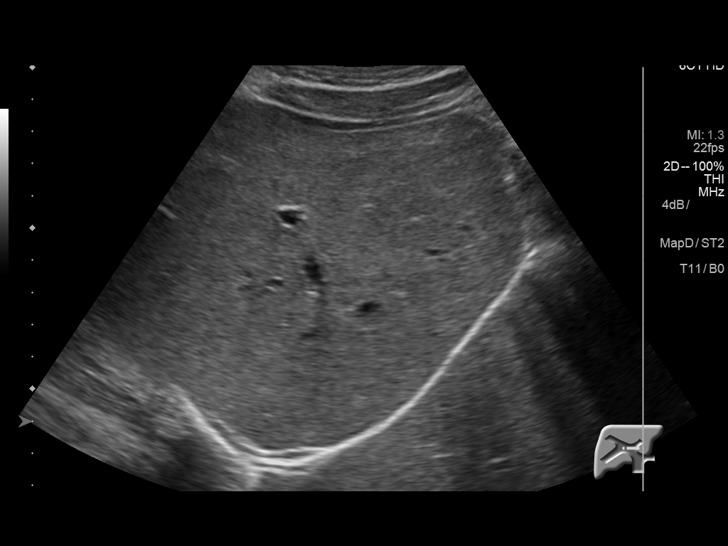
[im 48/143]
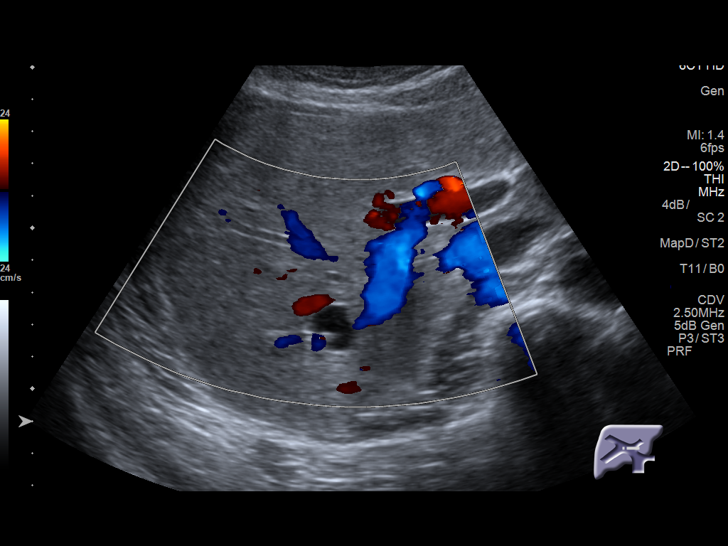
[im 60/143]
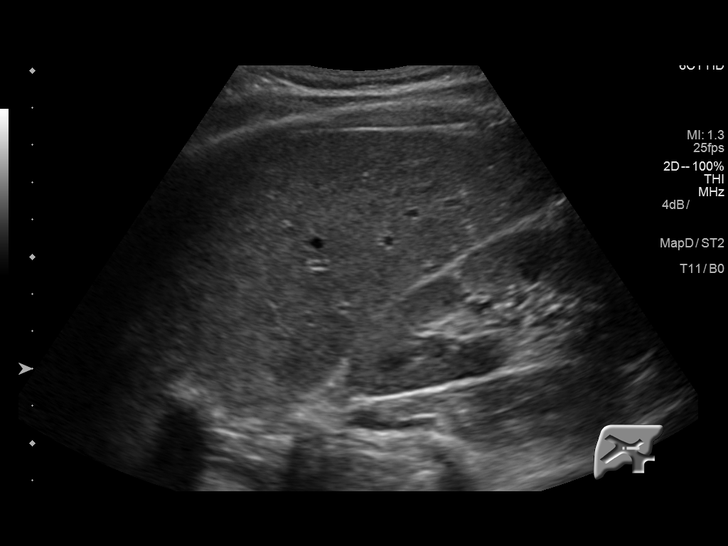
[im 72/143]
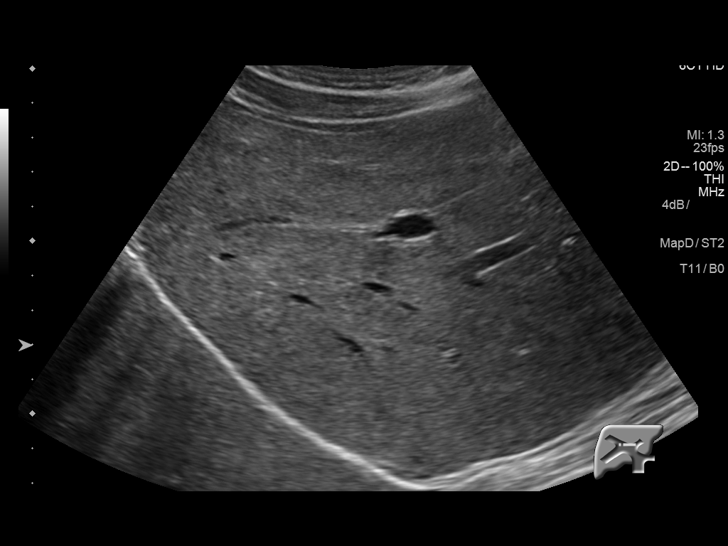
[im 83/143]
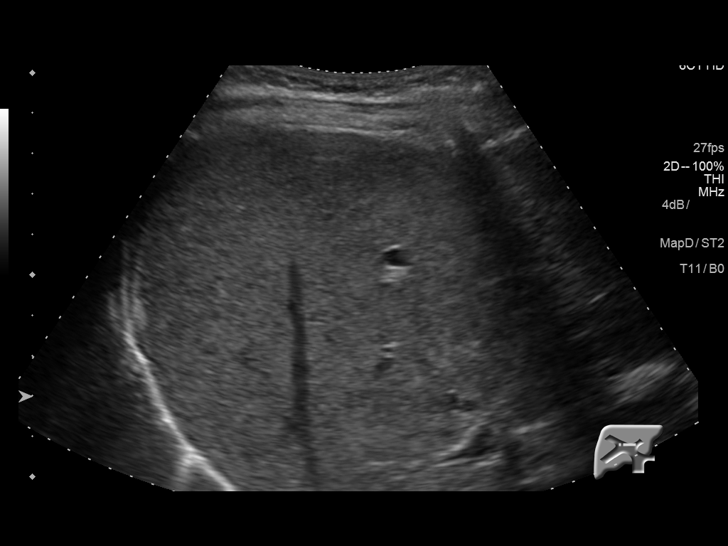
[im 95/143]
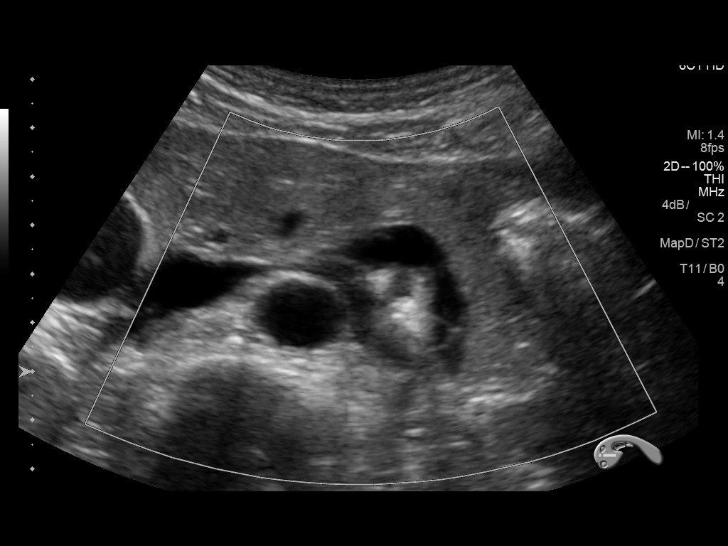
[im 107/143]
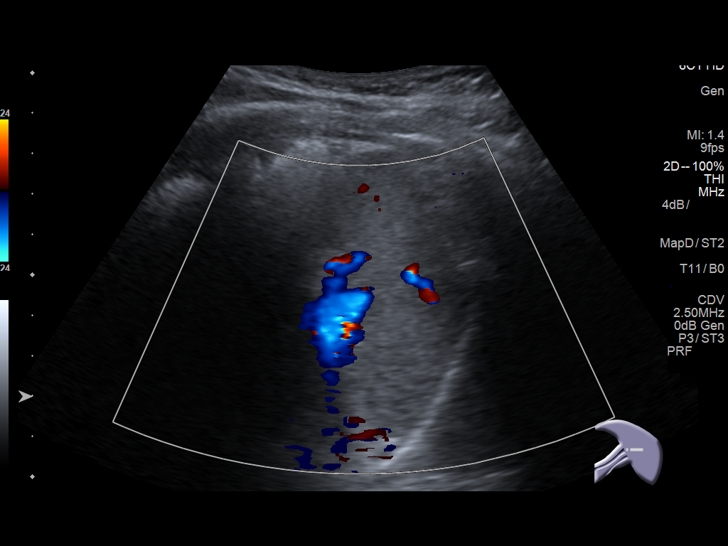
[im 119/143]
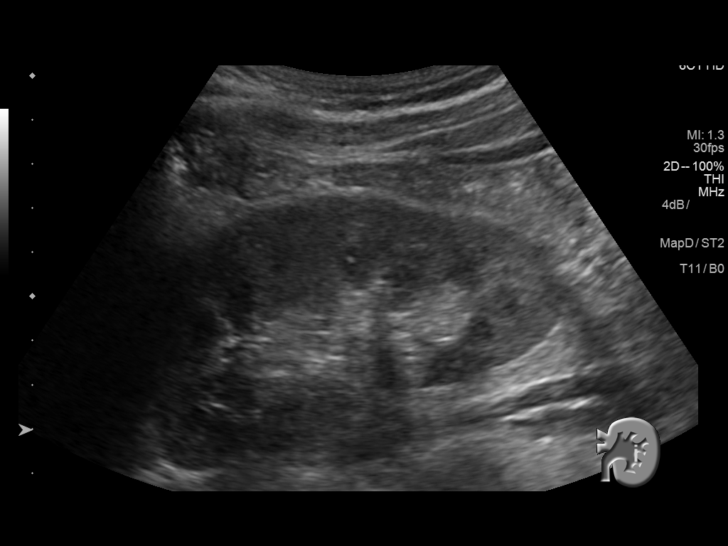
[im 131/143]
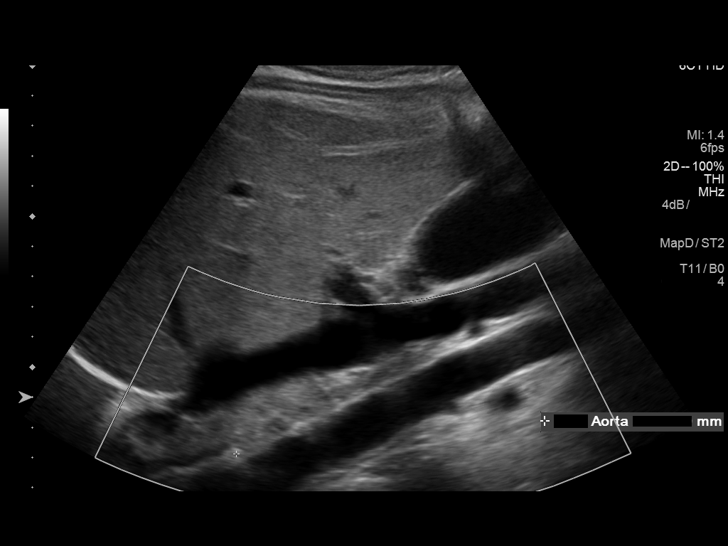
[im 143/143]
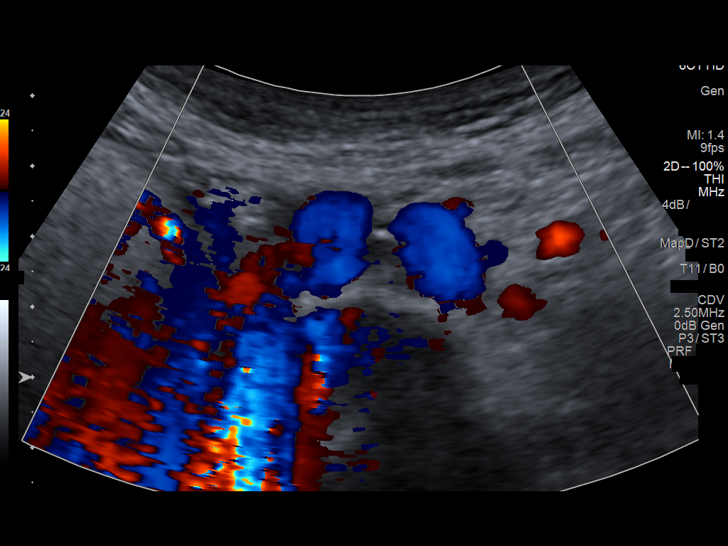

[13 of 25 positions shown; findings below may reference images not displayed]

FINDINGS: Gallbladder: The gallbladder is well visualized and only a small
amount of sludge is present. No gallstones are noted. There is no
pain over the gallbladder with compression.

Common bile duct: Diameter: The common bile duct is normal measuring
2.9 mm in diameter.

Liver: The liver has normal echogenic pattern. Multiple cysts
present the largest in the right lobe of 1.6 cm with some septation.
No solid hepatic lesion is seen.

IVC: No abnormality visualized.

Pancreas: The pancreas is moderately well seen with no abnormality
noted. The pancreatic duct is not dilated.

Spleen: The spleen is normal measuring 3.6 cm.

Right Kidney: Length: 8.9 cm..  No hydronephrosis is seen.

Left Kidney: Length: 9.9 cm..  No hydronephrosis is noted.

Abdominal aorta: The abdominal aorta is normal caliber.

Other findings: None.
IMPRESSION: 1. Multiple liver cysts. No solid hepatic abnormality. The
echogenicity of the liver parenchyma is within normal limits.
2. Small amount of gallbladder sludge.  No gallstones.
3. No hydronephrosis.

## 2017-05-20 MED ORDER — HYDROCORTISONE ACE-PRAMOXINE 1-1 % RE FOAM: 1.0000 | Freq: Two times a day (BID) | RECTAL | 0 refills | Status: DC

## 2017-05-20 NOTE — Patient Instructions (Addendum)
Rx for Proctofoam sent to her pharmacy Progress report in 2 weeks.

## 2017-05-20 NOTE — Progress Notes (Signed)
Subjective:  Wt 144 in November 2018  Patient ID: Christine Kidd, female    DOB: 1970/02/27, 47 y.o.   MRN: [id number]  HPI Here today for f/u. Las seen in November of 2018 Hx of IBS. Takes dicyclomine TID.  She also takes [employer] as needed.  She says she has been having some rectal bleeding. She says she has to wear a pantie liner.  The bleeding started about a week ago. Threre is no abdominal pain. She says she sometimes has upper abdominal pain.  She is not taking any Goody Powders.  Her appetite has decreased. The lat 3-4 days she has to go to the [facility] to have a BM.  11/201/2014 Colonoscopy: Intermittent diarrhea and rectal bleeding. Examination performed to cecum. Single small submucosal lipoma ascending colon.  Single small diverticulum at hepatic flexure. Distal rectal ulcer proximal to dentate line. I see taken for routine histology. Internal/external hemorrhoids.  Review of Systems Past Medical History:  Diagnosis Date  . Anemia   . Blood transfusion without reported diagnosis   . GERD (gastroesophageal reflux disease)   . Hypertension    x 14 yrs  . Rectal bleeding     Past Surgical History:  Procedure Laterality Date  . ABDOMINAL HYSTERECTOMY    . BILATERAL SALPINGECTOMY Bilateral 07/06/2016   Procedure: BILATERAL SALPINGECTOMY;  Surgeon: Jonnie Kind, MD;  Location: [facility];  Service: Gynecology;  Laterality: Bilateral;  . COLONOSCOPY N/A 11/30/2012   Procedure: COLONOSCOPY;  Surgeon: Rogene Houston, MD;  [facility]: [facility];  Service: Endoscopy;  Laterality: N/A;  830  . NO PAST SURGERIES    . OOPHORECTOMY Right 07/06/2016   Procedure: RIGHT OOPHORECTOMY;  Surgeon: Jonnie Kind, MD;  Location: [facility];  Service: Gynecology;  Laterality: Right;  . SUPRACERVICAL ABDOMINAL HYSTERECTOMY N/A 07/06/2016   Procedure: HYSTERECTOMY SUPRACERVICAL ABDOMINAL;  Surgeon: Jonnie Kind, MD;  Location: [facility];  Service: Gynecology;  Laterality: N/A;     No Known Allergies  Current Outpatient Medications on File Prior to Visit  Medication Sig Dispense Refill  . aspirin EC 81 MG tablet Take 81 mg by mouth daily.    . cetirizine (ZYRTEC) 10 MG tablet Take 10 mg by mouth daily as needed for allergies.    Marland Kitchen dicyclomine (BENTYL) 10 MG capsule Take 1 capsule (10 mg total) by mouth 3 (three) times daily. (Patient taking differently: Take 10 mg by mouth 3 (three) times daily as needed for spasms. ) 90 capsule 4  . docusate sodium (COLACE) 100 MG capsule Take 1 capsule (100 mg total) by mouth 2 (two) times daily as needed. 30 capsule 2  . ferrous sulfate 325 (65 FE) MG EC tablet Take 325 mg by mouth 3 (three) times daily with meals.    Marland Kitchen omeprazole (PRILOSEC) 40 MG capsule Take 1 capsule (40 mg total) by mouth daily. (Patient taking differently: Take 40 mg by mouth daily as needed (acid reflux). ) 90 capsule 3  . Probiotic Product (Detmold) CAPS Take 1 capsule by mouth daily as needed (IBS issues).     . triamterene-hydrochlorothiazide (MAXZIDE-25) 37.5-25 MG tablet Take 1 tablet by mouth daily. 90 tablet 2  . Wheat Dextrin (BENEFIBER DRINK MIX PO) Take by mouth 2 (two) times daily as needed (constipated). Teaspoonful     No current facility-administered medications on file prior to visit.         Objective:   Physical Exam Blood pressure 128/82, pulse 80, temperature 99  F (37.2 C), height 5\' 3"  (1.6 m), weight 142 lb (64.4 kg), last menstrual period 06/30/2016. Alert and oriented. Skin warm and dry. Oral mucosa is moist.   . Sclera anicteric, conjunctivae is pink. Thyroid not enlarged. No cervical lymphadenopathy. Lungs clear. Heart regular rate and rhythm.  Abdomen is soft. Bowel sounds are positive. No hepatomegaly. No abdominal masses felt. No tenderness.  No edema to lower extremities.  Rectal exam. External   Hemorrhoid with small amt of fresh blood.  No rectal mass felt.          Assessment & Plan:  IBS. She will  continue the Dicyclomine. She is doing well.  Hemorrhoidal rectal bleeding. Am going to start her on Proctofoam. 3 stools cards home with patient.

## 2017-06-17 ENCOUNTER — Encounter: Payer: Self-pay | Admitting: Family Medicine

## 2017-08-12 ENCOUNTER — Other Ambulatory Visit: Payer: Self-pay | Admitting: Obstetrics & Gynecology

## 2017-11-02 ENCOUNTER — Ambulatory Visit (INDEPENDENT_AMBULATORY_CARE_PROVIDER_SITE_OTHER): Payer: Commercial Managed Care - PPO | Admitting: Family Medicine

## 2017-11-02 ENCOUNTER — Encounter: Payer: Self-pay | Admitting: Family Medicine

## 2017-11-02 VITALS — BP 110/70 | HR 86 | Temp 98.6°F | Ht 63.0 in | Wt 147.3 lb

## 2017-11-02 DIAGNOSIS — K219 Gastro-esophageal reflux disease without esophagitis: Secondary | ICD-10-CM

## 2017-11-02 DIAGNOSIS — Z23 Encounter for immunization: Secondary | ICD-10-CM | POA: Diagnosis not present

## 2017-11-02 DIAGNOSIS — Z9109 Other allergy status, other than to drugs and biological substances: Secondary | ICD-10-CM | POA: Diagnosis not present

## 2017-11-02 DIAGNOSIS — D509 Iron deficiency anemia, unspecified: Secondary | ICD-10-CM | POA: Insufficient documentation

## 2017-11-02 DIAGNOSIS — I10 Essential (primary) hypertension: Secondary | ICD-10-CM

## 2017-11-02 DIAGNOSIS — E559 Vitamin D deficiency, unspecified: Secondary | ICD-10-CM | POA: Diagnosis not present

## 2017-11-02 DIAGNOSIS — E538 Deficiency of other specified B group vitamins: Secondary | ICD-10-CM

## 2017-11-02 DIAGNOSIS — Z13220 Encounter for screening for lipoid disorders: Secondary | ICD-10-CM | POA: Diagnosis not present

## 2017-11-02 DIAGNOSIS — K582 Mixed irritable bowel syndrome: Secondary | ICD-10-CM | POA: Diagnosis not present

## 2017-11-02 DIAGNOSIS — D500 Iron deficiency anemia secondary to blood loss (chronic): Secondary | ICD-10-CM | POA: Diagnosis not present

## 2017-11-02 DIAGNOSIS — R5383 Other fatigue: Secondary | ICD-10-CM | POA: Diagnosis not present

## 2017-11-02 LAB — CBC WITH DIFFERENTIAL/PLATELET
BASOS PCT: 1 % (ref 0.0–3.0)
Basophils Absolute: 0.1 10*3/uL (ref 0.0–0.1)
Eosinophils Absolute: 0.2 10*3/uL (ref 0.0–0.7)
Eosinophils Relative: 2.4 % (ref 0.0–5.0)
HCT: 41.6 % (ref 36.0–46.0)
HEMOGLOBIN: 14.1 g/dL (ref 12.0–15.0)
Lymphocytes Relative: 23 % (ref 12.0–46.0)
Lymphs Abs: 1.6 10*3/uL (ref 0.7–4.0)
MCHC: 34 g/dL (ref 30.0–36.0)
MCV: 91.3 fl (ref 78.0–100.0)
MONO ABS: 0.6 10*3/uL (ref 0.1–1.0)
Monocytes Relative: 9 % (ref 3.0–12.0)
Neutro Abs: 4.4 10*3/uL (ref 1.4–7.7)
Neutrophils Relative %: 64.6 % (ref 43.0–77.0)
Platelets: 260 10*3/uL (ref 150.0–400.0)
RBC: 4.56 Mil/uL (ref 3.87–5.11)
RDW: 12.5 % (ref 11.5–15.5)
WBC: 6.8 10*3/uL (ref 4.0–10.5)

## 2017-11-02 LAB — IBC PANEL
Iron: 69 ug/dL (ref 42–145)
Saturation Ratios: 19.9 % — ABNORMAL LOW (ref 20.0–50.0)
Transferrin: 248 mg/dL (ref 212.0–360.0)

## 2017-11-02 LAB — FERRITIN: Ferritin: 79.5 ng/mL (ref 10.0–291.0)

## 2017-11-02 LAB — LIPID PANEL
CHOLESTEROL: 210 mg/dL — AB (ref 0–200)
HDL: 65.8 mg/dL (ref >39.00)
LDL CALC: 123 mg/dL — AB (ref 0–99)
NONHDL: 144.26
Total CHOL/HDL Ratio: 3
Triglycerides: 106 mg/dL (ref 0.0–149.0)
VLDL: 21.2 mg/dL (ref 0.0–40.0)

## 2017-11-02 LAB — COMPREHENSIVE METABOLIC PANEL
ALBUMIN: 4.5 g/dL (ref 3.5–5.2)
ALK PHOS: 34 U/L — AB (ref 39–117)
ALT: 14 U/L (ref 0–35)
AST: 14 U/L (ref 0–37)
BUN: 17 mg/dL (ref 6–23)
CHLORIDE: 99 meq/L (ref 96–112)
CO2: 31 mEq/L (ref 19–32)
Calcium: 9.3 mg/dL (ref 8.4–10.5)
Creatinine, Ser: 0.98 mg/dL (ref 0.40–1.20)
GFR: 77.46 mL/min (ref >60.00)
Glucose, Bld: 82 mg/dL (ref 70–99)
POTASSIUM: 4.5 meq/L (ref 3.5–5.1)
SODIUM: 136 meq/L (ref 135–145)
Total Bilirubin: 0.7 mg/dL (ref 0.2–1.2)
Total Protein: 6.9 g/dL (ref 6.0–8.3)

## 2017-11-02 LAB — VITAMIN B12: Vitamin B-12: 1129 pg/mL — ABNORMAL HIGH (ref 211–911)

## 2017-11-02 LAB — VITAMIN D 25 HYDROXY (VIT D DEFICIENCY, FRACTURES): VITD: 17.25 ng/mL — ABNORMAL LOW (ref 30.00–100.00)

## 2017-11-02 LAB — TSH: TSH: 0.88 u[IU]/mL (ref 0.35–4.50)

## 2017-11-02 NOTE — Patient Instructions (Signed)
Check blood pressure daily at home. Message me numbers in a couple of weeks.

## 2017-11-02 NOTE — Progress Notes (Signed)
Christine Kidd DOB: 1970/07/12 Encounter date: 11/02/2017  This is a 47 y.o. female who presents to establish care. No chief complaint on file.   History of present illness: Christine Kidd is known to me from previous practice. She is doing well.   Rectal bleeding attributed to hemhorrhoids; although she has had a few colonoscopies. There were also rectal ulcers. Still has bleeding from time to time. Sees GI regularly. Has proctofoam on hand. Taking benefiber religiously. Tried [facility] but didn't note improvement with this. Has gotten better. Stress does trigger.   Made job change in January. Working for [employer] This is stressful. 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   HTN: Taking maxzide. Not checking regularly at home. Only dizzy/light headed if she is not compliant with medication or with flare up of bowel.    GERD: As long as monitoring what/when she is eating symptoms are well controlled.  IBS: following with GI. See above.   Has been taking iron supplement for anemia; but still feels wiped out. Has history of B12 and vitamin D deficiencies.    Past Medical History:  Diagnosis Date  . Anemia   . Blood transfusion without reported diagnosis   . GERD (gastroesophageal reflux disease)   . Hypertension    x 14 yrs  . Rectal bleeding    Past Surgical History:  Procedure Laterality Date  . BILATERAL SALPINGECTOMY Bilateral 07/06/2016   Procedure: BILATERAL SALPINGECTOMY;  Surgeon: Jonnie Kind, MD;  Location: [facility];  Service: Gynecology;  Laterality: Bilateral;  . COLONOSCOPY N/A 11/30/2012   Procedure: COLONOSCOPY;  Surgeon: Rogene Houston, MD;  [facility];  Service: Endoscopy;  Laterality: N/A;  830  . OOPHORECTOMY Right 07/06/2016   Procedure: RIGHT OOPHORECTOMY;  Surgeon: Jonnie Kind, MD;  Location: [facility];  Service: Gynecology;  Laterality: Right;  . SUPRACERVICAL ABDOMINAL HYSTERECTOMY N/A 07/06/2016   Procedure: HYSTERECTOMY  SUPRACERVICAL ABDOMINAL;  Surgeon: Jonnie Kind, MD;  Location: [facility];  Service: Gynecology;  Laterality: N/A;   Allergies  Allergen Reactions  . Latex Rash   Current Meds  Medication Sig  . aspirin EC 81 MG tablet Take 81 mg by mouth daily.  . cetirizine (ZYRTEC) 10 MG tablet Take 10 mg by mouth daily as needed for allergies.  Marland Kitchen dicyclomine (BENTYL) 10 MG capsule Take 1 capsule (10 mg total) by mouth 3 (three) times daily. (Patient taking differently: Take 10 mg by mouth 3 (three) times daily as needed for spasms. )  . ferrous sulfate 325 (65 FE) MG EC tablet Take 325 mg by mouth 3 (three) times daily with meals.  . triamterene-hydrochlorothiazide (MAXZIDE-25) 37.5-25 MG tablet TAKE 1 TABLET BY MOUTH EVERY DAY  . Wheat Dextrin (BENEFIBER DRINK MIX PO) Take by mouth 2 (two) times daily as needed (constipated). Teaspoonful   Social History   Tobacco Use  . Smoking status: Never Smoker  . Smokeless tobacco: Never Used  Substance Use Topics  . Alcohol use: Yes    Comment: rare wine   Family History  Problem Relation Age of Onset  . Other Mother        overdose  . Drug abuse Mother   . Early death Mother 72  . Hypertension Father   . Hyperlipidemia Father   . Cancer Paternal Grandmother        stomach  . Dementia Maternal Grandmother   . Cancer Paternal Aunt        breast  . Colon cancer  Neg Hx      Review of Systems  Constitutional: Negative for chills, fatigue and fever.  Respiratory: Negative for cough, chest tightness, shortness of breath and wheezing.   Cardiovascular: Negative for chest pain, palpitations and leg swelling.  Gastrointestinal: Negative for abdominal distention, abdominal pain, nausea and vomiting. Blood in stool: see HPI.    Objective:  BP 110/70 (BP Location: Left Arm, Patient Position: Sitting, Cuff Size: Normal)   Pulse 86   Temp 98.6 F (37 C) (Oral)   Ht 5\' 3"  (1.6 m)   Wt 147 lb 4.8 oz (66.8 kg)   LMP 06/30/2016   BMI 26.09 kg/m    Weight: 147 lb 4.8 oz (66.8 kg)   BP Readings from Last 3 Encounters:  11/02/17 110/70  05/20/17 128/82  01/03/17 128/82   Wt Readings from Last 3 Encounters:  11/02/17 147 lb 4.8 oz (66.8 kg)  05/20/17 142 lb (64.4 kg)  01/03/17 145 lb 0.6 oz (65.8 kg)    Physical Exam  Constitutional: She is oriented to person, place, and time. She appears well-developed and well-nourished. No distress.  Neck: Neck supple.  Cardiovascular: Normal rate, regular rhythm and normal heart sounds. Exam reveals no friction rub.  No murmur heard. No lower extremity edema  Pulmonary/Chest: Effort normal and breath sounds normal. No respiratory distress. She has no wheezes. She has no rales.  Abdominal: Soft. Bowel sounds are normal. She exhibits no distension and no mass. There is no tenderness.  Neurological: She is alert and oriented to person, place, and time.  Psychiatric: She has a normal mood and affect. Her behavior is normal. Cognition and memory are normal.    Assessment/Plan: 1. Essential hypertension, benign Stable. Advised checking at home and if running low can decrease hctz component. Will update me in 2 weeks.  - CBC with Differential/Platelet; Future - Comprehensive metabolic panel; Future  2. Gastroesophageal reflux disease, esophagitis presence not specified Stable; diet controlled.  3. Irritable bowel syndrome with both constipation and diarrhea Stable. Managed by GI.  4. Environmental allergies stable  5. Iron deficiency anemia due to chronic blood loss  - Ferritin; Future - Iron and TIBC; Future  6. B12 deficiency  - Vitamin B12; Future  7. Vitamin D deficiency  - VITAMIN D 25 Hydroxy (Vit-D Deficiency, Fractures); Future  8. Fatigue, unspecified type  - TSH; Future  9. Folate deficiency   10. Lipid screening  - Lipid panel; Future  11. Need for Tdap vaccination  - Tdap vaccine greater than or equal to 7yo IM   Return pending bloodwork.  Christine Rough, MD

## 2017-12-06 ENCOUNTER — Encounter: Payer: Self-pay | Admitting: Family Medicine

## 2017-12-12 ENCOUNTER — Ambulatory Visit (INDEPENDENT_AMBULATORY_CARE_PROVIDER_SITE_OTHER): Payer: BC Managed Care – PPO | Admitting: Internal Medicine

## 2018-02-23 ENCOUNTER — Encounter: Payer: Self-pay | Admitting: Internal Medicine

## 2018-02-23 ENCOUNTER — Ambulatory Visit (INDEPENDENT_AMBULATORY_CARE_PROVIDER_SITE_OTHER): Payer: Commercial Managed Care - PPO | Admitting: Internal Medicine

## 2018-02-23 ENCOUNTER — Encounter: Payer: Self-pay | Admitting: *Deleted

## 2018-02-23 VITALS — BP 120/80 | HR 93 | Temp 99.9°F | Wt 143.0 lb

## 2018-02-23 DIAGNOSIS — R52 Pain, unspecified: Secondary | ICD-10-CM | POA: Diagnosis not present

## 2018-02-23 DIAGNOSIS — R059 Cough, unspecified: Secondary | ICD-10-CM

## 2018-02-23 DIAGNOSIS — R05 Cough: Secondary | ICD-10-CM | POA: Diagnosis not present

## 2018-02-23 DIAGNOSIS — R6889 Other general symptoms and signs: Secondary | ICD-10-CM | POA: Diagnosis not present

## 2018-02-23 LAB — POC INFLUENZA A&B (BINAX/QUICKVUE)
INFLUENZA A, POC: NEGATIVE
Influenza B, POC: NEGATIVE

## 2018-02-23 NOTE — Patient Instructions (Signed)
-Hope you feel better soon!  -Rest, fluids, tylenol/ibuprofen as needed for pain, fever, mucinex.  -Schedule follow up in 10-14 days if not improved or if symptoms worsen.   Upper Respiratory Infection, Adult An upper respiratory infection (URI) affects the nose, throat, and upper air passages. URIs are caused by germs (viruses). The most common type of URI is often called "the common cold." Medicines cannot cure URIs, but you can do things at home to relieve your symptoms. URIs usually get better within 7-10 days. Follow these instructions at home: Activity  Rest as needed.  If you have a fever, stay home from work or school until your fever is gone, or until your doctor says you may return to work or school. ? You should stay home until you cannot spread the infection anymore (you are not contagious). ? Your doctor may have you wear a face mask so you have less risk of spreading the infection. Relieving symptoms  Gargle with a salt-water mixture 3-4 times a day or as needed. To make a salt-water mixture, completely dissolve -1 tsp of salt in 1 cup of warm water.  Use a cool-mist humidifier to add moisture to the air. This can help you breathe more easily. Eating and drinking   Drink enough fluid to keep your pee (urine) pale yellow.  Eat soups and other clear broths. General instructions   Take over-the-counter and prescription medicines only as told by your doctor. These include cold medicines, fever reducers, and cough suppressants.  Do not use any products that contain nicotine or tobacco. These include cigarettes and e-cigarettes. If you need help quitting, ask your doctor.  Avoid being where people are smoking (avoid secondhand smoke).  Make sure you get regular shots and get the flu shot every year.  Keep all follow-up visits as told by your doctor. This is important. How to avoid spreading infection to others   Wash your hands often with soap and water. If you do  not have soap and water, use hand sanitizer.  Avoid touching your mouth, face, eyes, or nose.  Cough or sneeze into a tissue or your sleeve or elbow. Do not cough or sneeze into your hand or into the air. Contact a doctor if:  You are getting worse, not better.  You have any of these: ? A fever. ? Chills. ? Brown or red mucus in your nose. ? Yellow or brown fluid (discharge)coming from your nose. ? Pain in your face, especially when you bend forward. ? Swollen neck glands. ? Pain with swallowing. ? White areas in the back of your throat. Get help right away if:  You have shortness of breath that gets worse.  You have very bad or constant: ? Headache. ? Ear pain. ? Pain in your forehead, behind your eyes, and over your cheekbones (sinus pain). ? Chest pain.  You have long-lasting (chronic) lung disease along with any of these: ? Wheezing. ? Long-lasting cough. ? Coughing up blood. ? A change in your usual mucus.  You have a stiff neck.  You have changes in your: ? Vision. ? Hearing. ? Thinking. ? Mood. Summary  An upper respiratory infection (URI) is caused by a germ called a virus. The most common type of URI is often called "the common cold."  URIs usually get better within 7-10 days.  Take over-the-counter and prescription medicines only as told by your doctor. This information is not intended to replace advice given to you by your health care  provider. Make sure you discuss any questions you have with your health care provider. Document Released: 06/16/2007 Document Revised: 08/20/2016 Document Reviewed: 08/20/2016 Elsevier Interactive Patient Education  2019 [employer].

## 2018-02-23 NOTE — Progress Notes (Signed)
Acute Office Visit     CC/Reason for Visit: flu like symptoms  HPI: Christine Kidd is a 48 y.o. female who is coming in today for the above mentioned reasons. 4 days ago onset of severe HA, chills, subjective fever (highest temp was 100.1), body aches and nausea. Sick co-workers, no recent travel. She has used BC powders, excedrin and ibuprofen for the HA with some relief but still present.   Past Medical/Surgical History: Past Medical History:  Diagnosis Date  . Anemia   . Blood transfusion without reported diagnosis   . GERD (gastroesophageal reflux disease)   . Hypertension    x 14 yrs  . Rectal bleeding     Past Surgical History:  Procedure Laterality Date  . BILATERAL SALPINGECTOMY Bilateral 07/06/2016   Procedure: BILATERAL SALPINGECTOMY;  Surgeon: Jonnie Kind, MD;  Location: [facility];  Service: Gynecology;  Laterality: Bilateral;  . COLONOSCOPY N/A 11/30/2012   Procedure: COLONOSCOPY;  Surgeon: Rogene Houston, MD;  [facility];  Service: Endoscopy;  Laterality: N/A;  830  . OOPHORECTOMY Right 07/06/2016   Procedure: RIGHT OOPHORECTOMY;  Surgeon: Jonnie Kind, MD;  Location: [facility];  Service: Gynecology;  Laterality: Right;  . SUPRACERVICAL ABDOMINAL HYSTERECTOMY N/A 07/06/2016   Procedure: HYSTERECTOMY SUPRACERVICAL ABDOMINAL;  Surgeon: Jonnie Kind, MD;  Location: [facility];  Service: Gynecology;  Laterality: N/A;    Social History:  reports that she has never smoked. She has never used smokeless tobacco. She reports current alcohol use. She reports that she does not use drugs.  Allergies: Allergies  Allergen Reactions  . Latex Rash    Family History:  Family History  Problem Relation Age of Onset  . Other Mother        overdose  . Drug abuse Mother   . Early death Mother 39  . Hypertension Father   . Hyperlipidemia Father   . Cancer Paternal Grandmother        stomach  . Dementia Maternal Grandmother   . Cancer Paternal  Aunt        breast  . Colon cancer Neg Hx      Current Outpatient Medications:  .  aspirin EC 81 MG tablet, Take 81 mg by mouth daily., Disp: , Rfl:  .  cetirizine (ZYRTEC) 10 MG tablet, Take 10 mg by mouth daily as needed for allergies., Disp: , Rfl:  .  dicyclomine (BENTYL) 10 MG capsule, Take 1 capsule (10 mg total) by mouth 3 (three) times daily. (Patient taking differently: Take 10 mg by mouth 3 (three) times daily as needed for spasms. ), Disp: 90 capsule, Rfl: 4 .  ferrous sulfate 325 (65 FE) MG EC tablet, Take 325 mg by mouth 3 (three) times daily with meals., Disp: , Rfl:  .  triamterene-hydrochlorothiazide (MAXZIDE-25) 37.5-25 MG tablet, TAKE 1 TABLET BY MOUTH EVERY DAY, Disp: 90 tablet, Rfl: 2 .  Wheat Dextrin (BENEFIBER DRINK MIX PO), Take by mouth 2 (two) times daily as needed (constipated). Teaspoonful, Disp: , Rfl:   Review of Systems:  Constitutional: Positive for fever, chills, appetite change and fatigue.  HEENT: Denies photophobia, eye pain, redness, hearing loss, ear pain,  sore throat, rhinorrhea, sneezing, mouth sores, trouble swallowing, neck pain, neck stiffness and tinnitus.  +congestion Respiratory: Denies SOB, DOE, chest tightness,  and wheezing, +cough   Cardiovascular: Denies chest pain, palpitations and leg swelling.  Gastrointestinal: Denies nausea, vomiting, abdominal pain, diarrhea, constipation, blood in stool and abdominal distention.  Genitourinary: Denies dysuria, urgency, frequency, hematuria, flank pain and difficulty urinating.  Endocrine: Denies: hot or cold intolerance, sweats, changes in hair or nails, polyuria, polydipsia. Musculoskeletal: Denies back pain, joint swelling, arthralgias and gait problem. +myalgias Skin: Denies pallor, rash and wound.  Neurological: Denies dizziness, seizures, syncope, weakness, light-headedness, numbness and headaches.  Hematological: Denies adenopathy. Easy bruising, personal or family bleeding history    Psychiatric/Behavioral: Denies suicidal ideation, mood changes, confusion, nervousness, sleep disturbance and agitation    Physical Exam: Vitals:   02/23/18 1438  BP: 120/80  Pulse: 93  Temp: 99.9 F (37.7 C)  TempSrc: Oral  SpO2: 99%  Weight: 143 lb (64.9 kg)    Body mass index is 25.33 kg/m.   Constitutional: NAD, calm, comfortable, non-toxic appearing Eyes: PERRL, lids and conjunctivae normal ENMT: Mucous membranes are moist. Posterior pharynx clear of any exudate or lesions. Normal dentition. Tympanic membrane is pearly white, no erythema or bulging. Respiratory: clear to auscultation bilaterally, no wheezing, no crackles. Normal respiratory effort. No accessory muscle use.  Cardiovascular: Regular rate and rhythm, no murmurs / rubs / gallops. No extremity edema. 2+ pedal pulses. No carotid bruits. .    Impression and Plan:  Flu-like symptoms/Viral URI -rapid flu test negative. -Given exam findings, PNA, pharyngitis, ear infection are not likely, hence abx have not been prescribed. -Have advised rest, fluids, OTC antihistamines, cough suppressants and mucinex. -RTC if no improvement in 10-14 days.      Patient Instructions  -Hope you feel better soon!  -Rest, fluids, tylenol/ibuprofen as needed for pain, fever, mucinex.  -Schedule follow up in 10-14 days if not improved or if symptoms worsen.   Upper Respiratory Infection, Adult An upper respiratory infection (URI) affects the nose, throat, and upper air passages. URIs are caused by germs (viruses). The most common type of URI is often called "the common cold." Medicines cannot cure URIs, but you can do things at home to relieve your symptoms. URIs usually get better within 7-10 days. Follow these instructions at home: Activity  Rest as needed.  If you have a fever, stay home from work or school until your fever is gone, or until your doctor says you may return to work or school. ? You should stay home  until you cannot spread the infection anymore (you are not contagious). ? Your doctor may have you wear a face mask so you have less risk of spreading the infection. Relieving symptoms  Gargle with a salt-water mixture 3-4 times a day or as needed. To make a salt-water mixture, completely dissolve -1 tsp of salt in 1 cup of warm water.  Use a cool-mist humidifier to add moisture to the air. This can help you breathe more easily. Eating and drinking   Drink enough fluid to keep your pee (urine) pale yellow.  Eat soups and other clear broths. General instructions   Take over-the-counter and prescription medicines only as told by your doctor. These include cold medicines, fever reducers, and cough suppressants.  Do not use any products that contain nicotine or tobacco. These include cigarettes and e-cigarettes. If you need help quitting, ask your doctor.  Avoid being where people are smoking (avoid secondhand smoke).  Make sure you get regular shots and get the flu shot every year.  Keep all follow-up visits as told by your doctor. This is important. How to avoid spreading infection to others   Wash your hands often with soap and water. If you do not have soap and water, use  hand sanitizer.  Avoid touching your mouth, face, eyes, or nose.  Cough or sneeze into a tissue or your sleeve or elbow. Do not cough or sneeze into your hand or into the air. Contact a doctor if:  You are getting worse, not better.  You have any of these: ? A fever. ? Chills. ? Brown or red mucus in your nose. ? Yellow or brown fluid (discharge)coming from your nose. ? Pain in your face, especially when you bend forward. ? Swollen neck glands. ? Pain with swallowing. ? White areas in the back of your throat. Get help right away if:  You have shortness of breath that gets worse.  You have very bad or constant: ? Headache. ? Ear pain. ? Pain in your forehead, behind your eyes, and over your  cheekbones (sinus pain). ? Chest pain.  You have long-lasting (chronic) lung disease along with any of these: ? Wheezing. ? Long-lasting cough. ? Coughing up blood. ? A change in your usual mucus.  You have a stiff neck.  You have changes in your: ? Vision. ? Hearing. ? Thinking. ? Mood. Summary  An upper respiratory infection (URI) is caused by a germ called a virus. The most common type of URI is often called "the common cold."  URIs usually get better within 7-10 days.  Take over-the-counter and prescription medicines only as told by your doctor. This information is not intended to replace advice given to you by your health care provider. Make sure you discuss any questions you have with your health care provider. Document Released: 06/16/2007 Document Revised: 08/20/2016 Document Reviewed: 08/20/2016 Elsevier Interactive Patient Education  2019 [place], [place] [facility]

## 2018-05-12 ENCOUNTER — Other Ambulatory Visit: Payer: Self-pay | Admitting: Obstetrics & Gynecology

## 2018-06-20 ENCOUNTER — Encounter: Payer: Self-pay | Admitting: Family Medicine

## 2018-06-21 ENCOUNTER — Other Ambulatory Visit: Payer: Self-pay | Admitting: Family Medicine

## 2018-06-21 MED ORDER — TRIAMTERENE-HCTZ 37.5-25 MG PO TABS: 1.0000 | ORAL_TABLET | Freq: Every day | ORAL | 1 refills | Status: DC

## 2018-10-25 ENCOUNTER — Telehealth: Payer: Self-pay | Admitting: Family Medicine

## 2018-10-25 NOTE — Telephone Encounter (Signed)
Patient dropped off [place] DMV forms  Fax forms to: [phone]   ATTN: Medical Review [facility] Keep fax confirmation  Call the patient to pick up forms after faxed at:   [phone] and/or [phone]  Disposition: Dr's folder

## 2018-10-25 NOTE — Telephone Encounter (Signed)
Forms placed on Dr Koberlein's desk.   

## 2018-10-29 NOTE — Telephone Encounter (Signed)
I have completed page 2 (page 3 wasn't included but maybe she gave to eye doctor to complete eye portion?)  -I do not have confirmation of Hep B immunization or MMR or TB testing that is required. Does she have confirmation of these immunizations? If not, we will have to either re-immunize or can order titers to test for immunity. I'm not sure their requirements for TB - we can either do this through bloodwork or TB testing.   I did not sign the yellow form because the above has to be completed first. Let me know how she would like to proceed so I can order appropriate bloodwork if needed.

## 2018-10-31 ENCOUNTER — Other Ambulatory Visit: Payer: Self-pay | Admitting: Family Medicine

## 2018-10-31 DIAGNOSIS — E559 Vitamin D deficiency, unspecified: Secondary | ICD-10-CM

## 2018-10-31 DIAGNOSIS — D500 Iron deficiency anemia secondary to blood loss (chronic): Secondary | ICD-10-CM

## 2018-10-31 DIAGNOSIS — Z111 Encounter for screening for respiratory tuberculosis: Secondary | ICD-10-CM

## 2018-10-31 DIAGNOSIS — E678 Other specified hyperalimentation: Secondary | ICD-10-CM

## 2018-10-31 DIAGNOSIS — E785 Hyperlipidemia, unspecified: Secondary | ICD-10-CM

## 2018-10-31 DIAGNOSIS — I10 Essential (primary) hypertension: Secondary | ICD-10-CM

## 2018-10-31 DIAGNOSIS — Z2839 Other underimmunization status: Secondary | ICD-10-CM

## 2018-10-31 NOTE — Telephone Encounter (Signed)
I called the pt and informed her of the message below.  Patient stated she would prefer to have lab testing and an appt was scheduled for 10/21 at 3:20pm.  Message sent to Dr Ethlyn Gallery for orders.

## 2018-10-31 NOTE — Telephone Encounter (Signed)
Left a message for the pt to return my call.  CRM also created.  

## 2018-10-31 NOTE — Telephone Encounter (Signed)
Labs have been ordered. If she would like to get flu shot at that time as well, please schedule. I did go ahead and add on cholesterol and her routine labs since she was due for this. She does not have to fast all day, but if she could fast at least 3 hours prior to bloodwork we can get fasting glucose that way.

## 2018-11-01 ENCOUNTER — Other Ambulatory Visit (INDEPENDENT_AMBULATORY_CARE_PROVIDER_SITE_OTHER): Payer: BC Managed Care – PPO

## 2018-11-01 ENCOUNTER — Other Ambulatory Visit: Payer: Self-pay

## 2018-11-01 DIAGNOSIS — Z23 Encounter for immunization: Secondary | ICD-10-CM

## 2018-11-01 DIAGNOSIS — D500 Iron deficiency anemia secondary to blood loss (chronic): Secondary | ICD-10-CM

## 2018-11-01 DIAGNOSIS — E785 Hyperlipidemia, unspecified: Secondary | ICD-10-CM | POA: Diagnosis not present

## 2018-11-01 DIAGNOSIS — I10 Essential (primary) hypertension: Secondary | ICD-10-CM | POA: Diagnosis not present

## 2018-11-01 DIAGNOSIS — Z2839 Other underimmunization status: Secondary | ICD-10-CM

## 2018-11-01 DIAGNOSIS — Z111 Encounter for screening for respiratory tuberculosis: Secondary | ICD-10-CM

## 2018-11-01 NOTE — Telephone Encounter (Signed)
Left a message for the pt to return my call.  

## 2018-11-02 LAB — CBC WITH DIFFERENTIAL/PLATELET
Basophils Absolute: 0.1 10*3/uL (ref 0.0–0.1)
Basophils Relative: 1.6 % (ref 0.0–3.0)
Eosinophils Absolute: 0.1 10*3/uL (ref 0.0–0.7)
Eosinophils Relative: 2.3 % (ref 0.0–5.0)
HCT: 42.6 % (ref 36.0–46.0)
Hemoglobin: 14.2 g/dL (ref 12.0–15.0)
Lymphocytes Relative: 34.4 % (ref 12.0–46.0)
Lymphs Abs: 2.2 10*3/uL (ref 0.7–4.0)
MCHC: 33.2 g/dL (ref 30.0–36.0)
MCV: 91.5 fl (ref 78.0–100.0)
Monocytes Absolute: 0.4 10*3/uL (ref 0.1–1.0)
Monocytes Relative: 5.6 % (ref 3.0–12.0)
Neutro Abs: 3.6 10*3/uL (ref 1.4–7.7)
Neutrophils Relative %: 56.1 % (ref 43.0–77.0)
Platelets: 245 10*3/uL (ref 150.0–400.0)
RBC: 4.66 Mil/uL (ref 3.87–5.11)
RDW: 12.9 % (ref 11.5–15.5)
WBC: 6.4 10*3/uL (ref 4.0–10.5)

## 2018-11-02 LAB — COMPREHENSIVE METABOLIC PANEL
ALT: 13 U/L (ref 0–35)
AST: 17 U/L (ref 0–37)
Albumin: 4.7 g/dL (ref 3.5–5.2)
Alkaline Phosphatase: 36 U/L — ABNORMAL LOW (ref 39–117)
BUN: 16 mg/dL (ref 6–23)
CO2: 29 mEq/L (ref 19–32)
Calcium: 9.7 mg/dL (ref 8.4–10.5)
Chloride: 97 mEq/L (ref 96–112)
Creatinine, Ser: 0.95 mg/dL (ref 0.40–1.20)
GFR: 75.23 mL/min (ref >60.00)
Glucose, Bld: 83 mg/dL (ref 70–99)
Potassium: 4.4 mEq/L (ref 3.5–5.1)
Sodium: 136 mEq/L (ref 135–145)
Total Bilirubin: 0.9 mg/dL (ref 0.2–1.2)
Total Protein: 7.1 g/dL (ref 6.0–8.3)

## 2018-11-02 LAB — LIPID PANEL
Cholesterol: 232 mg/dL — ABNORMAL HIGH (ref 0–200)
HDL: 65 mg/dL (ref >39.00)
LDL Cholesterol: 150 mg/dL — ABNORMAL HIGH (ref 0–99)
NonHDL: 167.08
Total CHOL/HDL Ratio: 4
Triglycerides: 84 mg/dL (ref 0.0–149.0)
VLDL: 16.8 mg/dL (ref 0.0–40.0)

## 2018-11-02 LAB — IBC + FERRITIN
Ferritin: 177.5 ng/mL (ref 10.0–291.0)
Iron: 125 ug/dL (ref 42–145)
Saturation Ratios: 38.7 % (ref 20.0–50.0)
Transferrin: 231 mg/dL (ref 212.0–360.0)

## 2018-11-03 LAB — QUANTIFERON-TB GOLD PLUS
Mitogen-NIL: >10 IU/mL
NIL: 0.03 IU/mL
QuantiFERON-TB Gold Plus: NEGATIVE
TB1-NIL: <0 IU/mL
TB2-NIL: 0 IU/mL

## 2018-11-03 LAB — MEASLES/MUMPS/RUBELLA IMMUNITY
Mumps IgG: 68.2 AU/mL
Rubella: 1.13 index
Rubeola IgG: 25.3 AU/mL

## 2018-11-03 LAB — HEPATITIS B SURFACE ANTIBODY, QUANTITATIVE: Hep B S AB Quant (Post): <5 m[IU]/mL — ABNORMAL LOW (ref >=10)

## 2018-11-22 ENCOUNTER — Ambulatory Visit (INDEPENDENT_AMBULATORY_CARE_PROVIDER_SITE_OTHER): Payer: BC Managed Care – PPO | Admitting: *Deleted

## 2018-11-22 ENCOUNTER — Other Ambulatory Visit: Payer: Self-pay

## 2018-11-22 DIAGNOSIS — Z23 Encounter for immunization: Secondary | ICD-10-CM

## 2019-01-01 ENCOUNTER — Other Ambulatory Visit: Payer: Self-pay | Admitting: Family Medicine

## 2019-01-19 ENCOUNTER — Other Ambulatory Visit: Payer: Self-pay

## 2019-01-22 ENCOUNTER — Ambulatory Visit (INDEPENDENT_AMBULATORY_CARE_PROVIDER_SITE_OTHER): Payer: BC Managed Care – PPO | Admitting: Family Medicine

## 2019-01-22 ENCOUNTER — Other Ambulatory Visit: Payer: Self-pay

## 2019-01-22 ENCOUNTER — Encounter: Payer: Self-pay | Admitting: Family Medicine

## 2019-01-22 VITALS — BP 120/80 | HR 70 | Temp 97.6°F | Ht 63.0 in | Wt 152.2 lb

## 2019-01-22 DIAGNOSIS — Z23 Encounter for immunization: Secondary | ICD-10-CM

## 2019-01-22 DIAGNOSIS — K219 Gastro-esophageal reflux disease without esophagitis: Secondary | ICD-10-CM | POA: Diagnosis not present

## 2019-01-22 DIAGNOSIS — K582 Mixed irritable bowel syndrome: Secondary | ICD-10-CM

## 2019-01-22 DIAGNOSIS — I10 Essential (primary) hypertension: Secondary | ICD-10-CM

## 2019-01-22 DIAGNOSIS — E559 Vitamin D deficiency, unspecified: Secondary | ICD-10-CM

## 2019-01-22 DIAGNOSIS — Z0000 Encounter for general adult medical examination without abnormal findings: Secondary | ICD-10-CM

## 2019-01-22 DIAGNOSIS — E538 Deficiency of other specified B group vitamins: Secondary | ICD-10-CM

## 2019-01-22 DIAGNOSIS — E785 Hyperlipidemia, unspecified: Secondary | ICD-10-CM

## 2019-01-22 DIAGNOSIS — D509 Iron deficiency anemia, unspecified: Secondary | ICD-10-CM

## 2019-01-22 DIAGNOSIS — Z9109 Other allergy status, other than to drugs and biological substances: Secondary | ICD-10-CM

## 2019-01-22 NOTE — Progress Notes (Signed)
Christine Kidd DOB: [birth year] Encounter date: 01/22/2019  This is a 49 y.o. female who presents for complete physical   History of present illness/Additional concerns: 1 year ago had made a job change that had some increased stress working for [employer]. 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   Completed blood work in October: Cholesterol is higher at that time.  Last Pap negative with HPV negative in 2018 by Dr. Glo Herring. Hasn't followed since surgery.   Mammogram: she is overdue  HTN: Taking maxzide. Not checking regularly at home. Checks sometimes. Only dizzy/light headed if she is not compliant with medication or with flare up of bowel.    GERD: As long as monitoring what/when she is eating symptoms are well controlled.   IBS: following with GI. See above. last colonoscopy was 4-5 years ago. Was seeing GI regularly until last year.   Has been taking iron supplement for anemia; but still feels wiped out. Has history of B12 and vitamin D deficiencies.   Has started back exercising regularly. Walking at least 20 minutes three times weekly. Does feel a little more energetic with this, but still not as energetic as she should.   Husbands sleep pattern is bad and he is up at night watching news; this affects her sleep.   Past Medical History:  Diagnosis Date  . Anemia   . Blood transfusion without reported diagnosis   . GERD (gastroesophageal reflux disease)   . Hypertension    x 14 yrs  . Rectal bleeding    Past Surgical History:  Procedure Laterality Date  . BILATERAL SALPINGECTOMY Bilateral 07/06/2016   Procedure: BILATERAL SALPINGECTOMY;  Surgeon: Jonnie Kind, MD;  Location: [facility];  Service: Gynecology;  Laterality: Bilateral;  . COLONOSCOPY N/A 11/30/2012   Procedure: COLONOSCOPY;  Surgeon: Rogene Houston, MD;  Location: [facility];  Service: Endoscopy;  Laterality: N/A;  830  . OOPHORECTOMY Right 07/06/2016   Procedure:  RIGHT OOPHORECTOMY;  Surgeon: Jonnie Kind, MD;  Location: [facility];  Service: Gynecology;  Laterality: Right;  . SUPRACERVICAL ABDOMINAL HYSTERECTOMY N/A 07/06/2016   Procedure: HYSTERECTOMY SUPRACERVICAL ABDOMINAL;  Surgeon: Jonnie Kind, MD;  Location: [facility];  Service: Gynecology;  Laterality: N/A;   Allergies  Allergen Reactions  . Latex Rash   No outpatient medications have been marked as taking for the 01/22/19 encounter (Office Visit) with Caren Macadam, MD.   Social History   Tobacco Use  . Smoking status: Never Smoker  . Smokeless tobacco: Never Used  Substance Use Topics  . Alcohol use: Yes    Comment: rare wine   Family History  Problem Relation Age of Onset  . Other Mother        overdose  . Drug abuse Mother   . Early death Mother 8  . Hypertension Father   . Hyperlipidemia Father   . Cancer Paternal Grandmother        stomach  . Dementia Maternal Grandmother   . Cancer Paternal Aunt        breast  . Colon cancer Neg Hx      Review of Systems  Constitutional: Negative for activity change, appetite change, chills, fatigue, fever and unexpected weight change.  HENT: Negative for congestion, ear pain, hearing loss, sinus pressure, sinus pain, sore throat and trouble swallowing.   Eyes: Negative for pain and visual disturbance.  Respiratory: Negative for cough, chest tightness, shortness of breath and wheezing.  Cardiovascular: Negative for chest pain, palpitations and leg swelling.  Gastrointestinal: Negative for abdominal pain, blood in stool, constipation, diarrhea, nausea and vomiting.  Genitourinary: Negative for difficulty urinating and menstrual problem.  Musculoskeletal: Negative for arthralgias and back pain.  Skin: Negative for rash.  Neurological: Negative for dizziness, weakness, numbness and headaches.  Hematological: Negative for adenopathy. Does not bruise/bleed easily.  Psychiatric/Behavioral: Negative for sleep disturbance and  suicidal ideas. The patient is not nervous/anxious.     CBC:  Lab Results  Component Value Date   WBC 6.4 11/01/2018   HGB 14.2 11/01/2018   HCT 42.6 11/01/2018   MCH 30.2 07/07/2016   MCHC 33.2 11/01/2018   RDW 12.9 11/01/2018   PLT 245.0 11/01/2018   MPV 10.3 12/09/2014   CMP: Lab Results  Component Value Date   NA 136 11/01/2018   K 4.4 11/01/2018   CL 97 11/01/2018   CO2 29 11/01/2018   ANIONGAP 4 (L) 07/07/2016   GLUCOSE 83 11/01/2018   BUN 16 11/01/2018   CREATININE 0.95 11/01/2018   CREATININE 1.05 10/08/2013   GFRAA >60 07/07/2016   CALCIUM 9.7 11/01/2018   PROT 7.1 11/01/2018   BILITOT 0.9 11/01/2018   ALKPHOS 36 (L) 11/01/2018   ALT 13 11/01/2018   AST 17 11/01/2018   LIPID: Lab Results  Component Value Date   CHOL 232 (H) 11/01/2018   TRIG 84.0 11/01/2018   HDL 65.00 11/01/2018   LDLCALC 150 (H) 11/01/2018    Objective:  BP 120/80 (BP Location: Left Arm, Patient Position: Sitting, Cuff Size: Normal)   Pulse 70   Temp 97.6 F (36.4 C) (Temporal)   Ht 5\' 3"  (1.6 m)   Wt 152 lb 3.2 oz (69 kg)   LMP 06/30/2016   SpO2 98%   BMI 26.96 kg/m   Weight: 152 lb 3.2 oz (69 kg)   BP Readings from Last 3 Encounters:  01/22/19 120/80  02/23/18 120/80  11/02/17 110/70   Wt Readings from Last 3 Encounters:  01/22/19 152 lb 3.2 oz (69 kg)  02/23/18 143 lb (64.9 kg)  11/02/17 147 lb 4.8 oz (66.8 kg)    Physical Exam Constitutional:      General: She is not in acute distress.    Appearance: She is well-developed.  HENT:     Head: Normocephalic and atraumatic.     Right Ear: External ear normal.     Left Ear: External ear normal.     Mouth/Throat:     Pharynx: No oropharyngeal exudate.  Eyes:     Conjunctiva/sclera: Conjunctivae normal.     Pupils: Pupils are equal, round, and reactive to light.  Neck:     Thyroid: No thyromegaly.  Cardiovascular:     Rate and Rhythm: Normal rate and regular rhythm.     Heart sounds: Normal heart sounds. No  murmur. No friction rub. No gallop.   Pulmonary:     Effort: Pulmonary effort is normal.     Breath sounds: Normal breath sounds.  Abdominal:     General: Bowel sounds are normal. There is no distension.     Palpations: Abdomen is soft. There is no mass.     Tenderness: There is no abdominal tenderness. There is no guarding.     Hernia: No hernia is present.  Musculoskeletal:        General: No tenderness or deformity. Normal range of motion.     Cervical back: Normal range of motion and neck supple.  Lymphadenopathy:  Cervical: No cervical adenopathy.  Skin:    General: Skin is warm and dry.     Findings: No rash.  Neurological:     Mental Status: She is alert and oriented to person, place, and time.     Deep Tendon Reflexes: Reflexes normal.     Reflex Scores:      Tricep reflexes are 2+ on the right side and 2+ on the left side.      Bicep reflexes are 2+ on the right side and 2+ on the left side.      Brachioradialis reflexes are 2+ on the right side and 2+ on the left side.      Patellar reflexes are 2+ on the right side and 2+ on the left side. Psychiatric:        Speech: Speech normal.        Behavior: Behavior normal.        Thought Content: Thought content normal.     Assessment/Plan: Health Maintenance Due  Topic Date Due  . PAP SMEAR-Modifier  01/14/2019   Health Maintenance reviewed.  1. Preventative health care She is working on daily exercise.  I encouraged her to work on healthy eating as well.  Her husband has suboptimally controlled diabetes, so I feel that they can work well together to work on healthier eating within the house.  She feels motivated to make this change. - MM DIGITAL SCREENING BILATERAL; Future  2. Essential hypertension, benign Well-controlled.  Continue current medication. - CBC with Differential/Platelet; Future - Comprehensive metabolic panel; Future  3. Gastroesophageal reflux disease, unspecified whether esophagitis  present Controlled with medication.  She does usually follow-up with GI, but is due for an appointment.  4. Irritable bowel syndrome with both constipation and diarrhea Stable currently. Will schedule follow-up with GI.  5. B12 deficiency - Vitamin B12; Future; previously elevated on last check.  She has cut back on supplementation.  6. Vitamin D deficiency We will check levels.  7. Environmental allergies Stable.  Continue current Zyrtec.  8. Iron deficiency anemia, unspecified iron deficiency anemia type She is taking iron daily.  She tolerates this okay and feels better now that stores have increased.  Anemia has improved as well. - IBC + Ferritin; Future  10. Elevated lipids - Lipid panel; Future  11. Need for hepatitis B vaccination - Hepatitis B vaccine adult IM  Return in about 6 months (around 07/22/2019) for Chronic condition visit with bloodwork prior.  [name], MD

## 2019-03-18 ENCOUNTER — Ambulatory Visit: Payer: Self-pay | Attending: Internal Medicine

## 2019-03-18 DIAGNOSIS — Z23 Encounter for immunization: Secondary | ICD-10-CM | POA: Insufficient documentation

## 2019-03-18 NOTE — Progress Notes (Signed)
   Covid-19 Vaccination Clinic  Name:  Christine Kidd    MRN: [id number] DOB: May 08, 1970  03/18/2019  [name] was observed post Covid-19 immunization for 15 minutes without incident. She was provided with Vaccine Information Sheet and instruction to access the V-Safe system.   [name] was instructed to call 911 with any severe reactions post vaccine: Marland Kitchen Difficulty breathing  . Swelling of face and throat  . A fast heartbeat  . A bad rash all over body  . Dizziness and weakness   Immunizations Administered    Name Date Dose VIS Date Route   Pfizer COVID-19 Vaccine 03/18/2019  6:01 PM 0.3 mL 12/22/2018 Intramuscular   Manufacturer: [place]   Lot: [id number]   [place]: [id number]

## 2019-04-08 ENCOUNTER — Ambulatory Visit: Payer: Self-pay | Attending: Internal Medicine

## 2019-04-08 DIAGNOSIS — Z23 Encounter for immunization: Secondary | ICD-10-CM

## 2019-04-08 NOTE — Progress Notes (Signed)
   Covid-19 Vaccination Clinic  Name:  Christine Kidd    MRN: [id number] DOB: May 14, 1970  04/08/2019  [name] was observed post Covid-19 immunization for 15 minutes without incident. She was provided with Vaccine Information Sheet and instruction to access the V-Safe system.   [name] was instructed to call 911 with any severe reactions post vaccine: Marland Kitchen Difficulty breathing  . Swelling of face and throat  . A fast heartbeat  . A bad rash all over body  . Dizziness and weakness   Immunizations Administered    Name Date Dose VIS Date Route   Pfizer COVID-19 Vaccine 04/08/2019  3:44 PM 0.3 mL 12/22/2018 Intramuscular   Manufacturer: [place]   Lot: [id number]   [place]: [id number]

## 2019-07-03 ENCOUNTER — Other Ambulatory Visit: Payer: Self-pay | Admitting: Family Medicine

## 2019-07-23 ENCOUNTER — Other Ambulatory Visit: Payer: Self-pay

## 2019-07-23 ENCOUNTER — Encounter: Payer: Self-pay | Admitting: Family Medicine

## 2019-07-23 ENCOUNTER — Ambulatory Visit: Payer: BC Managed Care – PPO | Admitting: Family Medicine

## 2019-07-23 VITALS — BP 108/78 | HR 96 | Temp 98.4°F | Ht 63.0 in | Wt 155.3 lb

## 2019-07-23 DIAGNOSIS — R5383 Other fatigue: Secondary | ICD-10-CM

## 2019-07-23 DIAGNOSIS — E559 Vitamin D deficiency, unspecified: Secondary | ICD-10-CM

## 2019-07-23 DIAGNOSIS — D500 Iron deficiency anemia secondary to blood loss (chronic): Secondary | ICD-10-CM | POA: Diagnosis not present

## 2019-07-23 DIAGNOSIS — E538 Deficiency of other specified B group vitamins: Secondary | ICD-10-CM | POA: Diagnosis not present

## 2019-07-23 DIAGNOSIS — I10 Essential (primary) hypertension: Secondary | ICD-10-CM | POA: Diagnosis not present

## 2019-07-23 DIAGNOSIS — E785 Hyperlipidemia, unspecified: Secondary | ICD-10-CM

## 2019-07-23 DIAGNOSIS — K582 Mixed irritable bowel syndrome: Secondary | ICD-10-CM

## 2019-07-23 DIAGNOSIS — K219 Gastro-esophageal reflux disease without esophagitis: Secondary | ICD-10-CM

## 2019-07-23 DIAGNOSIS — Z1159 Encounter for screening for other viral diseases: Secondary | ICD-10-CM

## 2019-07-23 NOTE — Progress Notes (Signed)
Christine Kidd DOB: [birth year] Encounter date: 07/23/2019  This is a 49 y.o. female who presents with Chief Complaint  Patient presents with  . Follow-up    History of present illness: Feeling more sluggish. Appetite is consistent. Not sleeping well. Increased stress. Youngest going to college. 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 Lots of transitions. 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   Last Pap negative with HPV negative in 2018 by Dr. Glo Herring. Hasn't followed since surgery.   Mammogram: she is overdue; hasn't called to schedule.   YJE:HUDJSH maxzide. Not checking regularly at home. Thinking normal; not checking all the time. No light headedness/dizziness. (hasn't eaten this morning)  GERD:As long as monitoring what/when she is eating symptoms are well controlled.  FWY:OVZCHYIFO with GI. See above.last colonoscopy was 4-5 years ago. Was seeing GI regularly until last year. She is taking fiber. She has some constipation - taking iron supplements TID.   Not exercising as much as she should. [place] out of routine.     Allergies  Allergen Reactions  . Latex Rash   Current Meds  Medication Sig  . aspirin EC 81 MG tablet Take 81 mg by mouth daily.  . cetirizine (ZYRTEC) 10 MG tablet Take 10 mg by mouth daily as needed for allergies.  Marland Kitchen dicyclomine (BENTYL) 10 MG capsule Take 1 capsule (10 mg total) by mouth 3 (three) times daily. (Patient taking differently: Take 10 mg by mouth 3 (three) times daily as needed for spasms. )  . ferrous sulfate 325 (65 FE) MG EC tablet Take 325 mg by mouth 3 (three) times daily with meals.  . triamterene-hydrochlorothiazide (MAXZIDE-25) 37.5-25 MG tablet TAKE 1 TABLET BY MOUTH EVERY DAY  . Wheat Dextrin (BENEFIBER DRINK MIX PO) Take by mouth 2 (two) times daily as needed (constipated). Teaspoonful    Review of Systems  Constitutional: Positive for fatigue. Negative for chills and fever.  Respiratory: Negative  for cough, chest tightness, shortness of breath and wheezing.   Cardiovascular: Negative for chest pain, palpitations and leg swelling.  Gastrointestinal: Positive for constipation.  Musculoskeletal: Negative for arthralgias and joint swelling.    Objective:  BP 108/78 (BP Location: Left Arm, Patient Position: Sitting, Cuff Size: Large)   Pulse 96   Temp 98.4 F (36.9 C) (Oral)   Ht 5\' 3"  (1.6 m)   Wt 155 lb 4.8 oz (70.4 kg)   LMP 06/30/2016   BMI 27.51 kg/m   Weight: 155 lb 4.8 oz (70.4 kg)   BP Readings from Last 3 Encounters:  07/23/19 108/78  01/22/19 120/80  02/23/18 120/80   Wt Readings from Last 3 Encounters:  07/23/19 155 lb 4.8 oz (70.4 kg)  01/22/19 152 lb 3.2 oz (69 kg)  02/23/18 143 lb (64.9 kg)    Physical Exam Constitutional:      General: She is not in acute distress.    Appearance: She is well-developed.  Cardiovascular:     Rate and Rhythm: Normal rate and regular rhythm.     Heart sounds: Normal heart sounds. No murmur heard.  No friction rub.  Pulmonary:     Effort: Pulmonary effort is normal. No respiratory distress.     Breath sounds: Normal breath sounds. No wheezing or rales.  Musculoskeletal:     Right lower leg: No edema.     Left lower leg: No edema.  Neurological:     Mental Status: She is alert and oriented to person, place, and time.  Psychiatric:  Behavior: Behavior normal.     Assessment/Plan  1. Essential hypertension, benign Well controlled. Continue current medication.   - CBC with Differential/Platelet; Future - Comprehensive metabolic panel; Future  2. Gastroesophageal reflux disease, unspecified whether esophagitis present Diet controlled; currently on bentyl prn.  3. B12 deficiency - Vitamin B12; Future  4. Iron deficiency anemia due to chronic blood loss She is taking supplementation TID; but concern with continued fatigue. - IBC + Ferritin; Future  5. Vitamin D deficiency - VITAMIN D 25 Hydroxy (Vit-D  Deficiency, Fractures); Future  6. Irritable bowel syndrome with both constipation and diarrhea Stable. More constipation with iron supplementation.  7. Encounter for hepatitis C screening test for low risk patient - Hepatitis C antibody; Future  8. Elevated lipids Diet controlled. She has not been as good about regular exercise but wants to get back on track with this. - Lipid panel; Future  9. Fatigue, unspecified type Will start with bloodwork and consider further evaluation pending results. - TSH; Future    Return in about 6 months (around 01/23/2020) for physical exam.    [name], MD

## 2019-07-23 NOTE — Addendum Note (Signed)
Addended by: Marrion Coy on: 07/23/2019 11:23 AM   Modules accepted: Orders

## 2019-07-23 NOTE — Addendum Note (Signed)
Addended by: Marrion Coy on: 07/23/2019 11:22 AM   Modules accepted: Orders

## 2019-07-24 LAB — TSH: TSH: 1.15 mIU/L

## 2019-07-24 LAB — CBC WITH DIFFERENTIAL/PLATELET
Absolute Monocytes: 412 cells/uL (ref 200–950)
Basophils Absolute: 78 cells/uL (ref 0–200)
Basophils Relative: 1.1 %
Eosinophils Absolute: 99 cells/uL (ref 15–500)
Eosinophils Relative: 1.4 %
HCT: 47.4 % — ABNORMAL HIGH (ref 35.0–45.0)
Hemoglobin: 15.6 g/dL — ABNORMAL HIGH (ref 11.7–15.5)
Lymphs Abs: 1527 cells/uL (ref 850–3900)
MCH: 30.6 pg (ref 27.0–33.0)
MCHC: 32.9 g/dL (ref 32.0–36.0)
MCV: 92.9 fL (ref 80.0–100.0)
MPV: 11.2 fL (ref 7.5–12.5)
Monocytes Relative: 5.8 %
Neutro Abs: 4984 cells/uL (ref 1500–7800)
Neutrophils Relative %: 70.2 %
Platelets: 222 10*3/uL (ref 140–400)
RBC: 5.1 10*6/uL (ref 3.80–5.10)
RDW: 12 % (ref 11.0–15.0)
Total Lymphocyte: 21.5 %
WBC: 7.1 10*3/uL (ref 3.8–10.8)

## 2019-07-24 LAB — COMPREHENSIVE METABOLIC PANEL
AG Ratio: 1.7 (calc) (ref 1.0–2.5)
ALT: 24 U/L (ref 6–29)
AST: 22 U/L (ref 10–35)
Albumin: 4.8 g/dL (ref 3.6–5.1)
Alkaline phosphatase (APISO): 39 U/L (ref 37–153)
BUN: 14 mg/dL (ref 7–25)
CO2: 23 mmol/L (ref 20–32)
Calcium: 9.8 mg/dL (ref 8.6–10.4)
Chloride: 99 mmol/L (ref 98–110)
Creat: 1.02 mg/dL (ref 0.50–1.05)
Globulin: 2.8 g/dL (calc) (ref 1.9–3.7)
Glucose, Bld: 87 mg/dL (ref 65–99)
Potassium: 4.3 mmol/L (ref 3.5–5.3)
Sodium: 136 mmol/L (ref 135–146)
Total Bilirubin: 1 mg/dL (ref 0.2–1.2)
Total Protein: 7.6 g/dL (ref 6.1–8.1)

## 2019-07-24 LAB — HEPATITIS C ANTIBODY
Hepatitis C Ab: NONREACTIVE
SIGNAL TO CUT-OFF: 0.01 (ref <1.00)

## 2019-07-24 LAB — LIPID PANEL
Cholesterol: 243 mg/dL — ABNORMAL HIGH (ref <200)
HDL: 74 mg/dL (ref >=50)
LDL Cholesterol (Calc): 146 mg/dL (calc) — ABNORMAL HIGH
Non-HDL Cholesterol (Calc): 169 mg/dL (calc) — ABNORMAL HIGH (ref <130)
Total CHOL/HDL Ratio: 3.3 (calc) (ref <5.0)
Triglycerides: 110 mg/dL (ref <150)

## 2019-07-24 LAB — IRON,TIBC AND FERRITIN PANEL
%SAT: 43 % (calc) (ref 16–45)
Ferritin: 238 ng/mL — ABNORMAL HIGH (ref 16–232)
Iron: 137 ug/dL (ref 45–160)
TIBC: 315 mcg/dL (calc) (ref 250–450)

## 2019-07-24 LAB — VITAMIN B12: Vitamin B-12: >2000 pg/mL — ABNORMAL HIGH (ref 200–1100)

## 2019-07-24 LAB — VITAMIN D 25 HYDROXY (VIT D DEFICIENCY, FRACTURES): Vit D, 25-Hydroxy: 26 ng/mL — ABNORMAL LOW (ref 30–100)

## 2020-01-01 ENCOUNTER — Other Ambulatory Visit: Payer: Self-pay | Admitting: Family Medicine

## 2020-01-28 ENCOUNTER — Encounter: Payer: BC Managed Care – PPO | Admitting: Family Medicine

## 2020-02-21 ENCOUNTER — Telehealth: Payer: BC Managed Care – PPO | Admitting: Physician Assistant

## 2020-02-21 DIAGNOSIS — R059 Cough, unspecified: Secondary | ICD-10-CM

## 2020-02-21 DIAGNOSIS — Z20822 Contact with and (suspected) exposure to covid-19: Secondary | ICD-10-CM

## 2020-02-21 DIAGNOSIS — Z1152 Encounter for screening for COVID-19: Secondary | ICD-10-CM

## 2020-02-21 MED ORDER — BENZONATATE 100 MG PO CAPS: 100.0000 mg | ORAL_CAPSULE | Freq: Three times a day (TID) | ORAL | 0 refills | Status: DC | PRN

## 2020-02-21 NOTE — Progress Notes (Signed)
A letter to return to work on Monday, 02/25/20, has been sent to your MyChart account.  You can find the letter under the Letters tab. If your test comes back positive and you are not feeling better, you may need further evaluation and an additional letter can be completed at that time.

## 2020-02-21 NOTE — Progress Notes (Signed)
Hi Tabitha,  It looks like it was recommended you re-test due to symptoms starting about a week after you initially tested negative.  Have you gone to be re-tested?  Do you know when your tests may come back if not available yet? If you have tested positive, can you upload your results to your MyChart account to keep your medical history up to date?

## 2020-02-21 NOTE — Progress Notes (Signed)
E-Visit for Corona Virus Screening  I am sorry you are feeling bad.  It looks like you were tested about a week before your symptoms started.  This means you could have been exposed recently, Your current symptoms could be consistent with the coronavirus and you should be tested, especially considering you have had a recent exposure.   [place] has multiple testing sites. For information on our [place] testing locations and hours go to [email or web address]  We are enrolling you in our [place] for [place] . Daily you will receive a questionnaire within the [place] website. Our COVID 19 response team will be monitoring your responses daily.  Testing Information: The COVID-19 Community Testing sites are testing BY APPOINTMENT ONLY.  You can schedule online at [email or web address]  If you do not have access to a smart phone or computer you may call [phone] for an appointment.   Additional testing sites in the Community:  . For [facility] Testing sites in [facility]  FaceUpdate.uy  . For Pop-up testing sites in [place]  BowlDirectory.co.uk  . For [facility] [email or web address]  . For [facility] testing in [place] and [employer] [email or web address]  . For [facility] testing in [facility]   [email or web address]  For  more information about community testing call [phone]   Please quarantine yourself while awaiting your test results. Please stay home for a minimum of 10 days from the first day of illness with improving symptoms and you have had 24 hours of no fever (without the use of Tylenol (Acetaminophen) Motrin  (Ibuprofen) or any fever reducing medication).  Also - Do not get tested prior to returning to work because once you have had a positive test the test can stay positive for more than a month in some cases.   You should wear a mask or cloth face covering over your nose and mouth if you must be around other people or animals, including pets (even at home). Try to stay at least 6 feet away from other people. This will protect the people around you.  Please continue good preventive care measures, including:  frequent hand-washing, avoid touching your face, cover coughs/sneezes, stay out of crowds and keep a 6 foot distance from others.  COVID-19 is a respiratory illness with symptoms that are similar to the flu. Symptoms are typically mild to moderate, but there have been cases of severe illness and death due to the virus.   The following symptoms may appear 2-14 days after exposure: . Fever . Cough . Shortness of breath or difficulty breathing . Chills . Repeated shaking with chills . Muscle pain . Headache . Sore throat . New loss of taste or smell . Fatigue . Congestion or runny nose . Nausea or vomiting . Diarrhea  Go to the nearest hospital ED for assessment if fever/cough/breathlessness are severe or illness seems like a threat to life.  It is vitally important that if you feel that you have an infection such as this virus or any other virus that you stay home and away from places where you may spread it to others.  You should avoid contact with people age 66 and older.   You can use medication such as A prescription cough medication called Tessalon Perles 100 mg. You may take 1-2 capsules every 8 hours as needed for cough  You may also take acetaminophen (Tylenol) as needed for fever.  Reduce your risk of any infection by using the same precautions used for  avoiding the common cold or flu:  Marland Kitchen Wash your hands often with soap and warm water for at least 20 seconds.  If soap and water are  not readily available, use an alcohol-based hand sanitizer with at least 60% alcohol.  . If coughing or sneezing, cover your mouth and nose by coughing or sneezing into the elbow areas of your shirt or coat, into a tissue or into your sleeve (not your hands). . Avoid shaking hands with others and consider head nods or verbal greetings only. . Avoid touching your eyes, nose, or mouth with unwashed hands.  . Avoid close contact with people who are sick. . Avoid places or events with large numbers of people in one location, like concerts or sporting events. . Carefully consider travel plans you have or are making. . If you are planning any travel outside or inside the [place], visit the CDC's Travelers' Health webpage for the latest health notices. . If you have some symptoms but not all symptoms, continue to monitor at home and seek medical attention if your symptoms worsen. . If you are having a medical emergency, call 911.  HOME CARE . Only take medications as instructed by your medical team. . Drink plenty of fluids and get plenty of rest. . A steam or ultrasonic humidifier can help if you have congestion.   GET HELP RIGHT AWAY IF YOU HAVE EMERGENCY WARNING SIGNS** FOR COVID-19. If you or someone is showing any of these signs seek emergency medical care immediately. Call 911 or proceed to your closest emergency facility if: . You develop worsening high fever. . Trouble breathing . Bluish lips or face . Persistent pain or pressure in the chest . New confusion . Inability to wake or stay awake . You cough up blood. . Your symptoms become more severe  **This list is not all possible symptoms. Contact your medical provider for any symptoms that are sever or concerning to you.  MAKE SURE YOU   Understand these instructions.  Will watch your condition.  Will get help right away if you are not doing well or get worse.  Your e-visit answers were reviewed by a board certified advanced clinical  practitioner to complete your personal care plan.  Depending on the condition, your plan could have included both over the counter or prescription medications.  If there is a problem please reply once you have received a response from your provider.  Your safety is important to [place].  If you have drug allergies check your prescription carefully.    You can use MyChart to ask questions about today's visit, request a non-urgent call back, or ask for a work or school excuse for 24 hours related to this e-Visit. If it has been greater than 24 hours you will need to follow up with your provider, or enter a new e-Visit to address those concerns. You will get an e-mail in the next two days asking about your experience.  I hope that your e-visit has been valuable and will speed your recovery. Thank you for using e-visits.   Greater than 5 minutes, yet less than 10 minutes of time have been spent researching, coordinating and implementing care for this patient today.

## 2020-03-28 ENCOUNTER — Encounter: Payer: Self-pay | Admitting: Family Medicine

## 2020-03-31 ENCOUNTER — Other Ambulatory Visit: Payer: Self-pay | Admitting: Family Medicine

## 2020-06-02 ENCOUNTER — Telehealth: Payer: Self-pay | Admitting: Physician Assistant

## 2020-06-02 DIAGNOSIS — J069 Acute upper respiratory infection, unspecified: Secondary | ICD-10-CM

## 2020-06-02 NOTE — Progress Notes (Signed)
Based on what you shared with me, I feel your condition warrants further evaluation and I recommend that you be seen in a face to face office visit. Neck stiffness and changes in voice in the setting of a cold or upper respiratory virus can be very concerning.  A physical exam can help rule out many of the most concerning causes of these.   NOTE: If you entered your credit card information for this eVisit, you will not be charged. You may see a "hold" on your card for the $35 but that hold will drop off and you will not have a charge processed.   If you are having a true medical emergency please call 911.      For an urgent face to face visit, [facility] has six urgent care centers for your convenience:     [facility] at  Get Driving Directions [phone] [place], [place] [address] . 8 am - 4 pm Monday - Friday    [facility] [place]) Get Driving Directions [phone] [address] [place], [place] [address] . 8 am to 8 pm Monday-Friday . 10 am to 6 pm [facility] Get Driving Directions [phone]  [address] [place],  [place]  [address] . 8 am to 8 pm Monday-Friday . 8 am to 4 pm [facility] Get Driving Directions [phone] [place], [place], [place] [address] . 8 am to 8 pm Monday-Friday . 8 am to 4 pm [facility] Get Driving Directions  [phone] [address] [place], [place] [address] . 8 am to 8 pm Monday-Friday . 8 am to 4 pm [facility] [place] Get Driving Directions [phone] [address], [place], [place] [address] . 8 am to 8 pm Monday-Friday . 8 am to 4 pm Saturday-Sunday     Your MyChart E-visit  questionnaire answers were reviewed by a board certified advanced clinical practitioner to complete your personal care plan based on your specific symptoms.  Thank you for using e-Visits.    Greater than 5 minutes, yet less than 10 minutes of time have been spent researching, coordinating, and implementing care for this patient today

## 2020-06-24 ENCOUNTER — Other Ambulatory Visit: Payer: Self-pay

## 2020-06-25 ENCOUNTER — Other Ambulatory Visit (HOSPITAL_COMMUNITY)
Admission: RE | Admit: 2020-06-25 | Discharge: 2020-06-25 | Disposition: A | Discharge status: Home / Self Care | Payer: BC Managed Care – PPO | Source: Ambulatory Visit | Attending: Family Medicine | Admitting: Family Medicine

## 2020-06-25 ENCOUNTER — Ambulatory Visit (INDEPENDENT_AMBULATORY_CARE_PROVIDER_SITE_OTHER): Payer: BC Managed Care – PPO | Admitting: Family Medicine

## 2020-06-25 ENCOUNTER — Encounter: Payer: Self-pay | Admitting: Family Medicine

## 2020-06-25 VITALS — BP 120/90 | HR 64 | Temp 98.6°F | Ht 63.0 in | Wt 159.5 lb

## 2020-06-25 DIAGNOSIS — Z124 Encounter for screening for malignant neoplasm of cervix: Secondary | ICD-10-CM | POA: Diagnosis present

## 2020-06-25 DIAGNOSIS — K219 Gastro-esophageal reflux disease without esophagitis: Secondary | ICD-10-CM

## 2020-06-25 DIAGNOSIS — R5383 Other fatigue: Secondary | ICD-10-CM | POA: Diagnosis not present

## 2020-06-25 DIAGNOSIS — Z23 Encounter for immunization: Secondary | ICD-10-CM

## 2020-06-25 DIAGNOSIS — E785 Hyperlipidemia, unspecified: Secondary | ICD-10-CM | POA: Diagnosis not present

## 2020-06-25 DIAGNOSIS — E559 Vitamin D deficiency, unspecified: Secondary | ICD-10-CM | POA: Diagnosis not present

## 2020-06-25 DIAGNOSIS — E538 Deficiency of other specified B group vitamins: Secondary | ICD-10-CM

## 2020-06-25 DIAGNOSIS — N898 Other specified noninflammatory disorders of vagina: Secondary | ICD-10-CM | POA: Diagnosis present

## 2020-06-25 DIAGNOSIS — Z9109 Other allergy status, other than to drugs and biological substances: Secondary | ICD-10-CM

## 2020-06-25 DIAGNOSIS — D500 Iron deficiency anemia secondary to blood loss (chronic): Secondary | ICD-10-CM | POA: Diagnosis not present

## 2020-06-25 DIAGNOSIS — Z0000 Encounter for general adult medical examination without abnormal findings: Secondary | ICD-10-CM | POA: Diagnosis not present

## 2020-06-25 DIAGNOSIS — I10 Essential (primary) hypertension: Secondary | ICD-10-CM | POA: Diagnosis not present

## 2020-06-25 DIAGNOSIS — Z1231 Encounter for screening mammogram for malignant neoplasm of breast: Secondary | ICD-10-CM

## 2020-06-25 LAB — LIPID PANEL
Cholesterol: 244 mg/dL — ABNORMAL HIGH (ref 0–200)
HDL: 73.1 mg/dL (ref >39.00)
LDL Cholesterol: 148 mg/dL — ABNORMAL HIGH (ref 0–99)
NonHDL: 171.09
Total CHOL/HDL Ratio: 3
Triglycerides: 113 mg/dL (ref 0.0–149.0)
VLDL: 22.6 mg/dL (ref 0.0–40.0)

## 2020-06-25 LAB — COMPREHENSIVE METABOLIC PANEL
ALT: 34 U/L (ref 0–35)
AST: 28 U/L (ref 0–37)
Albumin: 4.8 g/dL (ref 3.5–5.2)
Alkaline Phosphatase: 45 U/L (ref 39–117)
BUN: 14 mg/dL (ref 6–23)
CO2: 29 mEq/L (ref 19–32)
Calcium: 9.9 mg/dL (ref 8.4–10.5)
Chloride: 98 mEq/L (ref 96–112)
Creatinine, Ser: 1 mg/dL (ref 0.40–1.20)
GFR: 65 mL/min (ref >60.00)
Glucose, Bld: 90 mg/dL (ref 70–99)
Potassium: 4.3 mEq/L (ref 3.5–5.1)
Sodium: 135 mEq/L (ref 135–145)
Total Bilirubin: 0.9 mg/dL (ref 0.2–1.2)
Total Protein: 7.7 g/dL (ref 6.0–8.3)

## 2020-06-25 LAB — TSH: TSH: 1.52 u[IU]/mL (ref 0.35–4.50)

## 2020-06-25 LAB — CBC WITH DIFFERENTIAL/PLATELET
Basophils Absolute: 0.1 10*3/uL (ref 0.0–0.1)
Basophils Relative: 1.4 % (ref 0.0–3.0)
Eosinophils Absolute: 0.2 10*3/uL (ref 0.0–0.7)
Eosinophils Relative: 2.3 % (ref 0.0–5.0)
HCT: 43.3 % (ref 36.0–46.0)
Hemoglobin: 14.5 g/dL (ref 12.0–15.0)
Lymphocytes Relative: 24.1 % (ref 12.0–46.0)
Lymphs Abs: 1.6 10*3/uL (ref 0.7–4.0)
MCHC: 33.5 g/dL (ref 30.0–36.0)
MCV: 89.5 fl (ref 78.0–100.0)
Monocytes Absolute: 0.5 10*3/uL (ref 0.1–1.0)
Monocytes Relative: 7.4 % (ref 3.0–12.0)
Neutro Abs: 4.2 10*3/uL (ref 1.4–7.7)
Neutrophils Relative %: 64.8 % (ref 43.0–77.0)
Platelets: 264 10*3/uL (ref 150.0–400.0)
RBC: 4.84 Mil/uL (ref 3.87–5.11)
RDW: 13 % (ref 11.5–15.5)
WBC: 6.5 10*3/uL (ref 4.0–10.5)

## 2020-06-25 LAB — FOLATE: Folate: 10.1 ng/mL (ref >5.9)

## 2020-06-25 LAB — VITAMIN B12: Vitamin B-12: 1142 pg/mL — ABNORMAL HIGH (ref 211–911)

## 2020-06-25 LAB — VITAMIN D 25 HYDROXY (VIT D DEFICIENCY, FRACTURES): VITD: 36.99 ng/mL (ref 30.00–100.00)

## 2020-06-25 NOTE — Progress Notes (Signed)
Mertice Uffelman DOB: [birth year] Encounter date: 06/25/2020  This is a 50 y.o. female who presents for complete physical   History of present illness/Additional concerns:  Has been more sluggish, low energy. Really forgetful. Just not feeling her best. Initially felt like it was work/school, but seems to not be going away. Was sleeping ok, but now waking at 2-3 and going back to sleep 4-5. Tried napping but made her feel more antsy when she got up. Stopped exercising and can't motivate to get back. Stopped when weather was changing. Harder to concentrate. Just forgetting what she is going to do - ie going into pantry and forgetting why there.   mammogram: did not complete. Agrees to set this up if I order to location closer to her home.   HTN: Taking maxzide. Has been ok at home. Getting 120's/85 ish at home. Has had some headache off and on, but thinks seasonal.    GERD: As long as monitoring what/when she is eating symptoms are well controlled. doing much better now. Also more mindful.    IBS: bowels are doing well.   Could eat better for breakfast - husband helps her with this meal and likes to give her a little more than she wants.   Hasn't seen gyn since her follow up after hysterectomy. Done for fibroids, menorrhagia. No abnormal paps.   Past Medical History:  Diagnosis Date   Anemia    Blood transfusion without reported diagnosis    GERD (gastroesophageal reflux disease)    Hypertension    x 14 yrs   Rectal bleeding    Past Surgical History:  Procedure Laterality Date   BILATERAL SALPINGECTOMY Bilateral 07/06/2016   Procedure: BILATERAL SALPINGECTOMY;  Surgeon: Jonnie Kind, MD;  Location: [facility];  Service: Gynecology;  Laterality: Bilateral;   COLONOSCOPY N/A 11/30/2012   Procedure: COLONOSCOPY;  Surgeon: Rogene Houston, MD;  Location: [facility];  Service: Endoscopy;  Laterality: N/A;  830   OOPHORECTOMY Right 07/06/2016   Procedure: RIGHT OOPHORECTOMY;   Surgeon: Jonnie Kind, MD;  Location: [facility];  Service: Gynecology;  Laterality: Right;   SUPRACERVICAL ABDOMINAL HYSTERECTOMY N/A 07/06/2016   Procedure: HYSTERECTOMY SUPRACERVICAL ABDOMINAL;  Surgeon: Jonnie Kind, MD;  Location: [facility];  Service: Gynecology;  Laterality: N/A;   Allergies  Allergen Reactions   Latex Rash   Current Meds  Medication Sig   aspirin EC 81 MG tablet Take 81 mg by mouth daily.   cetirizine (ZYRTEC) 10 MG tablet Take 10 mg by mouth daily as needed for allergies.   dicyclomine (BENTYL) 10 MG capsule Take 1 capsule (10 mg total) by mouth 3 (three) times daily. (Patient taking differently: Take 10 mg by mouth 3 (three) times daily as needed for spasms.)   ferrous sulfate 325 (65 FE) MG EC tablet Take 325 mg by mouth 3 (three) times daily with meals.   triamterene-hydrochlorothiazide (MAXZIDE-25) 37.5-25 MG tablet TAKE 1 TABLET BY MOUTH EVERY DAY   Wheat Dextrin (BENEFIBER DRINK MIX PO) Take by mouth 2 (two) times daily as needed (constipated). Teaspoonful   Social History   Tobacco Use   Smoking status: Never   Smokeless tobacco: Never  Substance Use Topics   Alcohol use: Yes    Comment: rare wine   Family History  Problem Relation Age of Onset   Other Mother        overdose   Drug abuse Mother    Early death Mother 50   Hypertension Father  Hyperlipidemia Father    Cancer Paternal Grandmother        stomach   Dementia Maternal Grandmother    Cancer Paternal Aunt        breast   Colon cancer Neg Hx      Review of Systems  Constitutional:  Positive for fatigue. Negative for activity change, appetite change, chills, fever and unexpected weight change.  HENT:  Negative for congestion, ear pain, hearing loss, sinus pressure, sinus pain, sore throat and trouble swallowing.   Eyes:  Negative for pain and visual disturbance.  Respiratory:  Negative for cough, chest tightness, shortness of breath and wheezing.   Cardiovascular:  Negative for  chest pain, palpitations and leg swelling.  Gastrointestinal:  Negative for abdominal pain, blood in stool, constipation, diarrhea, nausea and vomiting.  Genitourinary:  Negative for difficulty urinating and menstrual problem.  Musculoskeletal:  Negative for arthralgias and back pain.  Skin:  Negative for rash.  Neurological:  Negative for dizziness, weakness, numbness and headaches.  Hematological:  Negative for adenopathy. Does not bruise/bleed easily.  Psychiatric/Behavioral:  Negative for sleep disturbance and suicidal ideas. The patient is not nervous/anxious.    CBC:  Lab Results  Component Value Date   WBC 6.5 06/25/2020   HGB 14.5 06/25/2020   HCT 43.3 06/25/2020   MCH 30.6 07/23/2019   MCHC 33.5 06/25/2020   RDW 13.0 06/25/2020   PLT 264.0 06/25/2020   MPV 11.2 07/23/2019   CMP: Lab Results  Component Value Date   NA 135 06/25/2020   K 4.3 06/25/2020   CL 98 06/25/2020   CO2 29 06/25/2020   ANIONGAP 4 (L) 07/07/2016   GLUCOSE 90 06/25/2020   BUN 14 06/25/2020   CREATININE 1.00 06/25/2020   CREATININE 1.02 07/23/2019   GFRAA >60 07/07/2016   CALCIUM 9.9 06/25/2020   PROT 7.7 06/25/2020   BILITOT 0.9 06/25/2020   ALKPHOS 45 06/25/2020   ALT 34 06/25/2020   AST 28 06/25/2020   LIPID: Lab Results  Component Value Date   CHOL 244 (H) 06/25/2020   TRIG 113.0 06/25/2020   HDL 73.10 06/25/2020   LDLCALC 148 (H) 06/25/2020   LDLCALC 146 (H) 07/23/2019    Objective:  BP 120/90 (BP Location: Left Arm, Patient Position: Sitting, Cuff Size: Normal)   Pulse 64   Temp 98.6 F (37 C) (Oral)   Ht 5\' 3"  (1.6 m)   Wt 159 lb 8 oz (72.3 kg)   LMP 06/30/2016   SpO2 99%   BMI 28.25 kg/m   Weight: 159 lb 8 oz (72.3 kg)   BP Readings from Last 3 Encounters:  06/25/20 120/90  07/23/19 108/78  01/22/19 120/80   Wt Readings from Last 3 Encounters:  06/25/20 159 lb 8 oz (72.3 kg)  07/23/19 155 lb 4.8 oz (70.4 kg)  01/22/19 152 lb 3.2 oz (69 kg)    Physical  Exam Constitutional:      General: She is not in acute distress.    Appearance: She is well-developed.  HENT:     Head: Normocephalic and atraumatic.     Right Ear: External ear normal.     Left Ear: External ear normal.     Mouth/Throat:     Pharynx: No oropharyngeal exudate.  Eyes:     Conjunctiva/sclera: Conjunctivae normal.     Pupils: Pupils are equal, round, and reactive to light.  Neck:     Thyroid: No thyromegaly.  Cardiovascular:     Rate and Rhythm: Normal rate  and regular rhythm.     Heart sounds: Normal heart sounds. No murmur heard.   No friction rub. No gallop.  Pulmonary:     Effort: Pulmonary effort is normal.     Breath sounds: Normal breath sounds.  Abdominal:     General: Bowel sounds are normal. There is no distension.     Palpations: Abdomen is soft. There is no mass.     Tenderness: There is no abdominal tenderness. There is no guarding.     Hernia: No hernia is present.  Genitourinary:    General: Normal vulva.     Exam position: Supine.     Vagina: Normal.     Cervix: Erythema present.     Comments: Multiple nabothian cysts with some erythema cervix/surrounding cysts.  Musculoskeletal:        General: No tenderness or deformity. Normal range of motion.     Cervical back: Normal range of motion and neck supple.  Lymphadenopathy:     Cervical: No cervical adenopathy.  Skin:    General: Skin is warm and dry.     Findings: No rash.  Neurological:     Mental Status: She is alert and oriented to person, place, and time.     Deep Tendon Reflexes: Reflexes normal.     Reflex Scores:      Tricep reflexes are 2+ on the right side and 2+ on the left side.      Bicep reflexes are 2+ on the right side and 2+ on the left side.      Brachioradialis reflexes are 2+ on the right side and 2+ on the left side.      Patellar reflexes are 2+ on the right side and 2+ on the left side. Psychiatric:        Speech: Speech normal.        Behavior: Behavior normal.         Thought Content: Thought content normal.    Assessment/Plan: Health Maintenance Due  Topic Date Due   PAP SMEAR-Modifier  01/14/2019   COVID-19 Vaccine (4 - Booster for Pfizer series) 02/12/2020   Health Maintenance reviewed - interested in shingrix vaccination; we will complete today.  1. Preventative health care She will continue to work on healthy eating and plans to restart exercise routine.  Felt better when she was doing this on a regular basis.  2. Essential hypertension, benign Blood pressure well controlled.  Continue Maxide 37.5-25 mg daily. - CBC with Differential/Platelet; Future - Comprehensive metabolic panel; Future - Comprehensive metabolic panel - CBC with Differential/Platelet  3. Gastroesophageal reflux disease, unspecified whether esophagitis present Significant improvement in symptoms.  She is able to control with diet at this point.  4. Iron deficiency anemia due to chronic blood loss She is tolerating ferrous sulfate and taking on a regular basis.  5. B12 deficiency - Vitamin B12; Future - Homocysteine; Future - Methylmalonic acid, serum; Future - Folate; Future - Folate - Methylmalonic acid, serum - Homocysteine - Vitamin B12  6. Vitamin D deficiency  - VITAMIN D 25 Hydroxy (Vit-D Deficiency, Fractures); Future - VITAMIN D 25 Hydroxy (Vit-D Deficiency, Fractures)  7. Environmental allergies Continue with Zyrtec  8. Encounter for screening mammogram for malignant neoplasm of breast  - MM DIGITAL SCREENING BILATERAL; Future  9. Fatigue, unspecified type  - CBC with Differential/Platelet; Future - Comprehensive metabolic panel; Future - Comprehensive metabolic panel - CBC with Differential/Platelet  10. Elevated lipids  - Lipid panel; Future - TSH; Future -  TSH - Lipid panel  11. Cervical cancer screening No history of abnormal Pap smears, hysterectomy done for fibroids and menorrhagia.  Cervix appears somewhat irritated today  on exam, so Pap smear was completed. - PAP [[place]]  12. Need for shingles vaccine - Varicella-zoster vaccine IM (Shingrix)  13. Vaginal discharge - Cervicovaginal ancillary only   Return in about 6 months (around 12/25/2020) for Chronic condition visit.  [name], MD

## 2020-06-26 LAB — CERVICOVAGINAL ANCILLARY ONLY
Bacterial Vaginitis (gardnerella): NEGATIVE
Candida Glabrata: NEGATIVE
Candida Vaginitis: NEGATIVE
Chlamydia: NEGATIVE
Comment: NEGATIVE
Comment: NEGATIVE
Comment: NEGATIVE
Comment: NEGATIVE
Comment: NEGATIVE
Comment: NORMAL
Neisseria Gonorrhea: NEGATIVE
Trichomonas: NEGATIVE

## 2020-06-29 LAB — METHYLMALONIC ACID, SERUM: Methylmalonic Acid, Quant: 96 nmol/L (ref 87–318)

## 2020-06-29 LAB — HOMOCYSTEINE: Homocysteine: 9.8 umol/L (ref <10.4)

## 2020-06-30 LAB — CYTOLOGY - PAP
Comment: NEGATIVE
Diagnosis: NEGATIVE
Diagnosis: REACTIVE
High risk HPV: NEGATIVE

## 2020-07-03 ENCOUNTER — Other Ambulatory Visit: Payer: Self-pay | Admitting: Family Medicine

## 2020-07-15 ENCOUNTER — Telehealth: Payer: BC Managed Care – PPO | Admitting: Physician Assistant

## 2020-07-15 DIAGNOSIS — U071 COVID-19: Secondary | ICD-10-CM

## 2020-07-15 MED ORDER — BENZONATATE 100 MG PO CAPS: 100.0000 mg | ORAL_CAPSULE | Freq: Three times a day (TID) | ORAL | 0 refills | Status: DC | PRN

## 2020-07-15 NOTE — Progress Notes (Signed)
I have spent 5 minutes in review of e-visit questionnaire, review and updating patient chart, medical decision making and response to patient.   Pierina Schuknecht Cody Lorna Strother, PA-C    

## 2020-07-15 NOTE — Progress Notes (Signed)
Duplicate encounter. No charge.

## 2020-07-15 NOTE — Progress Notes (Signed)

## 2020-10-11 ENCOUNTER — Other Ambulatory Visit: Payer: Self-pay | Admitting: Family Medicine

## 2020-11-18 ENCOUNTER — Telehealth: Payer: BC Managed Care – PPO | Admitting: Physician Assistant

## 2020-11-18 DIAGNOSIS — J069 Acute upper respiratory infection, unspecified: Secondary | ICD-10-CM

## 2020-11-18 MED ORDER — FLUTICASONE PROPIONATE 50 MCG/ACT NA SUSP: 2.0000 | Freq: Every day | NASAL | 0 refills | Status: DC

## 2020-11-18 MED ORDER — BENZONATATE 100 MG PO CAPS: 100.0000 mg | ORAL_CAPSULE | Freq: Three times a day (TID) | ORAL | 0 refills | Status: DC | PRN

## 2020-11-18 NOTE — Progress Notes (Signed)
E-Visit for Upper Respiratory Infection   We are sorry you are not feeling well.  Here is how we plan to help!  Based on what you have shared with me, it looks like you may have a viral upper respiratory infection.  Upper respiratory infections are caused by a large number of viruses; however, rhinovirus is the most common cause.   Giving your known exposure I do want you to retake a home COVID test to make sure your first test was not a false negative. If test is positive, let us know ASAP. If negative, please follow care instructions below.   Symptoms vary from person to person, with common symptoms including sore throat, cough, fatigue or lack of energy and feeling of general discomfort.  A low-grade fever of up to 100.4 may present, but is often uncommon.  Symptoms vary however, and are closely related to a person's age or underlying illnesses.  The most common symptoms associated with an upper respiratory infection are nasal discharge or congestion, cough, sneezing, headache and pressure in the ears and face.  These symptoms usually persist for about 3 to 10 days, but can last up to 2 weeks.  It is important to know that upper respiratory infections do not cause serious illness or complications in most cases.    Upper respiratory infections can be transmitted from person to person, with the most common method of transmission being a person's hands.  The virus is able to live on the skin and can infect other persons for up to 2 hours after direct contact.  Also, these can be transmitted when someone coughs or sneezes; thus, it is important to cover the mouth to reduce this risk.  To keep the spread of the illness at [place], good hand hygiene is very important.  This is an infection that is most likely caused by a virus. There are no specific treatments other than to help you with the symptoms until the infection runs its course.  We are sorry you are not feeling well.  Here is how we plan to  help!   For nasal congestion, you may use an oral decongestants such as Mucinex D or if you have glaucoma or high blood pressure use plain Mucinex.  Saline nasal spray or nasal drops can help and can safely be used as often as needed for congestion.  For your congestion, I have prescribed Fluticasone nasal spray one spray in each nostril twice a day  If you do not have a history of heart disease, hypertension, diabetes or thyroid disease, prostate/bladder issues or glaucoma, you may also use Sudafed to treat nasal congestion.  It is highly recommended that you consult with a pharmacist or your primary care physician to ensure this medication is safe for you to take.     If you have a cough, you may use cough suppressants such as Delsym and Robitussin.  If you have glaucoma or high blood pressure, you can also use Coricidin HBP.   For cough I have prescribed for you A prescription cough medication called Tessalon Perles 100 mg. You may take 1-2 capsules every 8 hours as needed for cough  If you have a sore or scratchy throat, use a saltwater gargle-  to  teaspoon of salt dissolved in a 4-ounce to 8-ounce glass of warm water.  Gargle the solution for approximately 15-30 seconds and then spit.  It is important not to swallow the solution.  You can also use throat lozenges/cough drops and  Chloraseptic spray to help with throat pain or discomfort.  Warm or cold liquids can also be helpful in relieving throat pain.  For headache, pain or general discomfort, you can use Ibuprofen or Tylenol as directed.   Some authorities believe that zinc sprays or the use of Echinacea may shorten the course of your symptoms.   HOME CARE Only take medications as instructed by your medical team. Be sure to drink plenty of fluids. Water is fine as well as fruit juices, sodas and electrolyte beverages. You may want to stay away from caffeine or alcohol. If you are nauseated, try taking small sips of liquids. How do you  know if you are getting enough fluid? Your urine should be a pale yellow or almost colorless. Get rest. Taking a steamy shower or using a humidifier may help nasal congestion and ease sore throat pain. You can place a towel over your head and breathe in the steam from hot water coming from a faucet. Using a saline nasal spray works much the same way. Cough drops, hard candies and sore throat lozenges may ease your cough. Avoid close contacts especially the very young and the elderly Cover your mouth if you cough or sneeze Always remember to wash your hands.   GET HELP RIGHT AWAY IF: You develop worsening fever. If your symptoms do not improve within 10 days You develop yellow or green discharge from your nose over 3 days. You have coughing fits You develop a severe head ache or visual changes. You develop shortness of breath, difficulty breathing or start having chest pain Your symptoms persist after you have completed your treatment plan  MAKE SURE YOU  Understand these instructions. Will watch your condition. Will get help right away if you are not doing well or get worse.  Thank you for choosing an e-visit.  Your e-visit answers were reviewed by a board certified advanced clinical practitioner to complete your personal care plan. Depending upon the condition, your plan could have included both over the counter or prescription medications.  Please review your pharmacy choice. Make sure the pharmacy is open so you can pick up prescription now. If there is a problem, you may contact your provider through [employer] and have the prescription routed to another pharmacy.  Your safety is important to [place]. If you have drug allergies check your prescription carefully.   For the next 24 hours you can use MyChart to ask questions about today's visit, request a non-urgent call back, or ask for a work or school excuse. You will get an email in the next two days asking about your experience.  I hope that your e-visit has been valuable and will speed your recovery.

## 2020-11-18 NOTE — Progress Notes (Signed)
I have spent 5 minutes in review of e-visit questionnaire, review and updating patient chart, medical decision making and response to patient.   Arjun Hard Cody Chike Farrington, PA-C    

## 2020-11-19 ENCOUNTER — Ambulatory Visit
Admission: EM | Admit: 2020-11-19 | Discharge: 2020-11-19 | Disposition: A | Discharge status: Home / Self Care | Payer: BC Managed Care – PPO | Attending: Family Medicine | Admitting: Family Medicine

## 2020-11-19 ENCOUNTER — Other Ambulatory Visit: Payer: Self-pay

## 2020-11-19 DIAGNOSIS — Z20822 Contact with and (suspected) exposure to covid-19: Secondary | ICD-10-CM | POA: Diagnosis not present

## 2020-11-19 DIAGNOSIS — J069 Acute upper respiratory infection, unspecified: Secondary | ICD-10-CM

## 2020-11-19 MED ORDER — OSELTAMIVIR PHOSPHATE 75 MG PO CAPS: 75.0000 mg | ORAL_CAPSULE | Freq: Two times a day (BID) | ORAL | 0 refills | Status: DC

## 2020-11-19 MED ORDER — PROMETHAZINE-DM 6.25-15 MG/5ML PO SYRP: 5.0000 mL | ORAL_SOLUTION | Freq: Four times a day (QID) | ORAL | 0 refills | Status: DC | PRN

## 2020-11-19 NOTE — ED Triage Notes (Signed)
Patient states she is having a wet cough and sore throat. She is coughing up flym that started Sunday.Taking Mucinex for the symptoms. Last dose at noon.  Denied Fever.

## 2020-11-20 LAB — COVID-19, FLU A+B NAA
Influenza A, NAA: NOT DETECTED
Influenza B, NAA: NOT DETECTED
SARS-CoV-2, NAA: NOT DETECTED

## 2020-11-23 NOTE — ED Provider Notes (Signed)
[facility]    CSN: [id number] Arrival date & time: 11/19/20  1711      History   Chief Complaint No chief complaint on file.   HPI Christine Kidd is a 50 y.o. female.   Presenting today with productive cough, sore throat, fatigue, body aches x 2-3 days. Denies fever, CP, SOB, abdominal pain, N/V/D. Taking mucinex with minimal relief. Hx of seasonal allergies on prn antihistamines, no known pulmonary dz. Multiple sick contacts recently.    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   IBS (irritable bowel syndrome) 04/03/2013   GERD (gastroesophageal reflux disease) 01/16/2013   Essential hypertension, benign 11/20/2012    Past Surgical History:  Procedure Laterality Date   BILATERAL SALPINGECTOMY Bilateral 07/06/2016   Procedure: BILATERAL SALPINGECTOMY;  Surgeon: Jonnie Kind, MD;  Location: [facility];  Service: Gynecology;  Laterality: Bilateral;   COLONOSCOPY N/A 11/30/2012   Procedure: COLONOSCOPY;  Surgeon: Rogene Houston, MD;  Location: [facility];  Service: Endoscopy;  Laterality: N/A;  830   OOPHORECTOMY Right 07/06/2016   Procedure: RIGHT OOPHORECTOMY;  Surgeon: Jonnie Kind, MD;  Location: [facility];  Service: Gynecology;  Laterality: Right;   SUPRACERVICAL ABDOMINAL HYSTERECTOMY N/A 07/06/2016   Procedure: HYSTERECTOMY SUPRACERVICAL ABDOMINAL;  Surgeon: Jonnie Kind, MD;  Location: [facility];  Service: Gynecology;  Laterality: N/A;    OB History     Gravida  2   Para  2   Term  2   Preterm      AB      Living  2       SAB      IAB      Ectopic      Multiple      Live Births  2            Home Medications    Prior to Admission medications   Medication Sig Start Date End Date Taking? Authorizing Provider  oseltamivir (TAMIFLU) 75 MG capsule Take 1 capsule (75 mg total) by mouth every 12 (twelve) hours. 11/19/20  Yes Volney American, PA-C  promethazine-dextromethorphan (PROMETHAZINE-DM) 6.25-15 MG/5ML syrup Take 5 mLs by mouth 4 (four) times daily as needed for cough. 11/19/20  Yes Volney American, PA-C  aspirin EC 81 MG tablet Take 81 mg by mouth daily.    [provider]  benzonatate (TESSALON) 100 MG capsule Take 1 capsule (100 mg total) by mouth 3 (three) times daily as needed for cough. 11/18/20   Brunetta Jeans, PA-C  cetirizine (ZYRTEC) 10 MG tablet Take 10 mg by mouth daily as needed for allergies.    [provider]  dicyclomine (BENTYL) 10 MG capsule Take 1 capsule (10 mg total) by mouth 3 (three) times daily. Patient taking differently: Take 10 mg by mouth 3 (three) times daily as needed for spasms. 07/31/13   Setzer, Rona Ravens, NP  ferrous sulfate 325 (65 FE) MG EC tablet Take 325 mg by mouth 3 (three) times daily with meals.    [provider]  fluticasone (FLONASE) 50 MCG/ACT nasal spray Place 2 sprays into both nostrils daily. 11/18/20   Brunetta Jeans, PA-C  triamterene-hydrochlorothiazide (MAXZIDE-25) 37.5-25 MG tablet TAKE 1 TABLET BY MOUTH EVERY DAY 10/13/20   Koberlein, Steele Berg, MD  Wheat Dextrin (BENEFIBER DRINK MIX PO) Take by mouth 2 (two) times daily as needed (constipated). Teaspoonful    [provider]    Family History Family History  Problem Relation Age of Onset   Other Mother        overdose   Drug abuse Mother    Early death Mother 32   Hypertension Father    Hyperlipidemia Father    Cancer Paternal Grandmother        stomach   Dementia Maternal Grandmother    Cancer Paternal Aunt        breast   Colon  cancer Neg Hx     Social History Social History   Tobacco Use   Smoking status: Never   Smokeless tobacco: Never  Vaping Use   Vaping Use: Never used  Substance Use Topics   Alcohol use: Yes    Comment: rare wine   Drug use: No     Allergies   Latex   Review of Systems Review of Systems PER HPI   Physical Exam Triage Vital Signs ED Triage Vitals  Enc Vitals Group     BP 11/19/20 1825 (!) 132/94     Pulse Rate 11/19/20 1825 85     Resp 11/19/20 1825 16     Temp 11/19/20 1825 99.1 F (37.3 C)     Temp Source 11/19/20 1825 Oral     SpO2 11/19/20 1825 98 %     Weight --      Height --      Head Circumference --      Peak Flow --      Pain Score 11/19/20 1823 4     Pain Loc --      Pain Edu? --      Excl. in [place]? --    No data found.  Updated Vital Signs BP (!) 132/94 (BP Location: Right Arm)   Pulse 85   Temp 99.1 F (37.3 C) (Oral)   Resp 16   LMP 06/30/2016   SpO2 98%   Visual Acuity Right Eye Distance:   Left Eye Distance:   Bilateral Distance:    Right Eye Near:   Left Eye Near:    Bilateral Near:     Physical Exam Vitals and nursing note reviewed.  Constitutional:      Appearance: Normal appearance. She is not ill-appearing.  HENT:     Head: Atraumatic.     Right Ear: Tympanic membrane and external ear normal.     Left Ear: Tympanic membrane and external ear normal.     Nose: Rhinorrhea present.     Mouth/Throat:     Mouth: Mucous membranes are moist.     Pharynx: Posterior oropharyngeal erythema present.  Eyes:     Extraocular Movements: Extraocular movements intact.     Conjunctiva/sclera: Conjunctivae normal.  Cardiovascular:     Rate and Rhythm: Normal rate and regular rhythm.     Heart sounds: Normal heart sounds.  Pulmonary:     Effort: Pulmonary effort is normal.     Breath sounds: Normal breath sounds. No wheezing.  Musculoskeletal:        General: Normal range of motion.     Cervical back: Normal range of motion and  neck supple.  Skin:    General: Skin is warm and dry.  Neurological:     Mental Status: She is alert and oriented to person, place, and time.  Psychiatric:        Mood and Affect: Mood normal.        Thought Content: Thought content normal.        Judgment: Judgment normal.     UC Treatments / Results  Labs (all labs ordered are listed, but only abnormal results are displayed) Labs Reviewed  COVID-19, FLU A+B NAA   Narrative:    Performed at:  [address] [place], [place]  [id number] Lab Director: Rush Farmer MD, Phone:  [phone]    EKG   Radiology No results found.  Procedures Procedures (including critical care time)  Medications Ordered in UC Medications - No data to display  Initial Impression / Assessment and Plan / UC Course  I have reviewed the triage vital signs and the nursing notes.  Pertinent labs & imaging results that were available during my care of the patient were reviewed by me and considered in my medical decision making (see chart for details).     Vitals and exam reassuring, suspect viral illness. COVID and flu test pending, will start tamiflu proactively given sxs and exposures. Phenergan DM sent for cough, discussed OTC remedies and supportive home care. Return for acutely worsening sxs. Work note given.   Final Clinical Impressions(s) / UC Diagnoses   Final diagnoses:  Exposure to COVID-19 virus  Viral URI with cough   Discharge Instructions   None    ED Prescriptions     Medication Sig Dispense Auth. Provider   oseltamivir (TAMIFLU) 75 MG capsule Take 1 capsule (75 mg total) by mouth every 12 (twelve) hours. 10 capsule Volney American, [place]   promethazine-dextromethorphan (PROMETHAZINE-DM) 6.25-15 MG/5ML syrup Take 5 mLs by mouth 4 (four) times daily as needed for cough. 100 mL Volney American, [place]      PDMP not reviewed this encounter.   Volney American, [place] 11/23/20  2131

## 2021-04-13 ENCOUNTER — Other Ambulatory Visit: Payer: Self-pay | Admitting: Family Medicine

## 2021-07-23 ENCOUNTER — Other Ambulatory Visit: Payer: Self-pay | Admitting: *Deleted

## 2021-07-23 MED ORDER — TRIAMTERENE-HCTZ 37.5-25 MG PO TABS: 1.0000 | ORAL_TABLET | Freq: Every day | ORAL | 0 refills | Status: DC

## 2021-08-20 ENCOUNTER — Other Ambulatory Visit: Payer: Self-pay | Admitting: Family

## 2021-09-18 ENCOUNTER — Inpatient Hospital Stay: Admit: 2021-09-18 | Payer: BC Managed Care – PPO | Admitting: Internal Medicine

## 2021-09-18 ENCOUNTER — Encounter (HOSPITAL_COMMUNITY): Payer: Self-pay

## 2021-09-25 ENCOUNTER — Ambulatory Visit (INDEPENDENT_AMBULATORY_CARE_PROVIDER_SITE_OTHER): Payer: BC Managed Care – PPO | Admitting: Family Medicine

## 2021-09-25 ENCOUNTER — Encounter: Payer: Self-pay | Admitting: Family Medicine

## 2021-09-25 VITALS — BP 124/84 | HR 76 | Temp 98.2°F | Ht 63.0 in | Wt 159.1 lb

## 2021-09-25 DIAGNOSIS — I10 Essential (primary) hypertension: Secondary | ICD-10-CM | POA: Diagnosis not present

## 2021-09-25 DIAGNOSIS — Z23 Encounter for immunization: Secondary | ICD-10-CM | POA: Diagnosis not present

## 2021-09-25 DIAGNOSIS — I201 Angina pectoris with documented spasm: Secondary | ICD-10-CM

## 2021-09-25 HISTORY — DX: Angina pectoris with documented spasm: I20.1

## 2021-09-25 NOTE — Progress Notes (Signed)
Established Patient Office Visit  Subjective   Patient ID: Christine Kidd, female    DOB: 18-Jul-1970  Age: 51 y.o. MRN: [id number]  No chief complaint on file.   Patient 9/923 was admittd to [facility] with acute chest pain, elevated troponin and NSTEMI. She underwent LHC which showed coronary vasospasm, her arteries were otherwise normal. She was discharged on 9/12 with aspirin, aotrvastatin, imdur, lopressor and brilinta, her maxzide was discontinued. Since leaving the hospital she has not had any recurrence of her chest pain but it feels like there is a "gas bubble" around the left axillary area. Is having normal bowel movements since leaving the hospital. Has a follow up appt with the cardiologist on 10/06/21.    {History (Optional):23778}  Review of Systems  Gastrointestinal:  Positive for nausea.  Neurological:  Positive for headaches.  All other systems reviewed and are negative.     Objective:     BP 124/84 (BP Location: Left Arm, Patient Position: Sitting, Cuff Size: Normal)   Pulse 76   Temp 98.2 F (36.8 C) (Oral)   Ht '5\' 3"'$  (1.6 m)   Wt 159 lb 1.6 oz (72.2 kg)   LMP 06/30/2016 Comment: postop hysterectomy  SpO2 98%   BMI 28.18 kg/m  {Vitals History (Optional):23777}  Physical Exam Vitals reviewed.  Constitutional:      Appearance: Normal appearance. She is well-groomed and normal weight.  HENT:     Head: Normocephalic and atraumatic.     Mouth/Throat:     Mouth: Mucous membranes are moist.     Pharynx: Oropharynx is clear.  Eyes:     Extraocular Movements: Extraocular movements intact.     Conjunctiva/sclera: Conjunctivae normal.     Pupils: Pupils are equal, round, and reactive to light.  Cardiovascular:     Rate and Rhythm: Normal rate and regular rhythm.     Pulses: Normal pulses.     Heart sounds: S1 normal and S2 normal.  Pulmonary:     Effort: Pulmonary effort is normal.     Breath sounds: Normal breath sounds and air entry.  Abdominal:      General: Abdomen is flat. Bowel sounds are normal.     Palpations: Abdomen is soft.  Musculoskeletal:        General: Normal range of motion.     Right lower leg: No edema.     Left lower leg: No edema.  Skin:    General: Skin is warm and dry.  Neurological:     Mental Status: She is alert and oriented to person, place, and time. Mental status is at baseline.     Gait: Gait is intact.  Psychiatric:        Mood and Affect: Mood and affect normal.        Speech: Speech normal.        Behavior: Behavior normal.        Judgment: Judgment normal.     No results found for any visits on 09/25/21.  {Labs (Optional):23779}  The ASCVD Risk score (Arnett DK, et al., 2019) failed to calculate for the following reasons:   The patient has a prior MI or stroke diagnosis    Assessment & Plan:   Problem List Items Addressed This Visit       Cardiovascular and Mediastinum   Essential hypertension, benign - Primary   Relevant Medications   isosorbide mononitrate (IMDUR) 30 MG 24 hr tablet   metoprolol tartrate (LOPRESSOR) 25 MG tablet   atorvastatin (  LIPITOR) 40 MG tablet   Coronary vasospasm (HCC)   Relevant Medications   isosorbide mononitrate (IMDUR) 30 MG 24 hr tablet   metoprolol tartrate (LOPRESSOR) 25 MG tablet   atorvastatin (LIPITOR) 40 MG tablet    No follow-ups on file.    Farrel Conners, MD

## 2021-09-25 NOTE — Patient Instructions (Addendum)
OK to take nasacort nasal spray or Coricidin HBP, or Robitussin DM. OK to take Tylenol 500 mg every 4-6 hours as needed for headache, AVOID ibuprofen and naproxen.

## 2021-10-01 NOTE — Assessment & Plan Note (Signed)
Current hypertension medications:      Sig   metoprolol tartrate (LOPRESSOR) 25 MG tablet (Taking) Take 25 mg by mouth daily.    BP is well controlled today on the above medication. Will continue this as prescribed.

## 2021-10-01 NOTE — Assessment & Plan Note (Signed)
None since her admission-- patient is reporting she is feeling very tired on the current medications they gave her . Is not sure why she is on the brilinta, she did not have any stents placed and did not need any coronary intervention at the time. I advised that she speak with the cardiologist about this medication and they may decide if it can be discontinued.

## 2021-10-23 ENCOUNTER — Encounter: Payer: BC Managed Care – PPO | Admitting: Family Medicine

## 2021-11-21 ENCOUNTER — Encounter (INDEPENDENT_AMBULATORY_CARE_PROVIDER_SITE_OTHER): Payer: Self-pay | Admitting: Gastroenterology

## 2022-01-13 ENCOUNTER — Encounter: Payer: Self-pay | Admitting: Emergency Medicine

## 2022-01-13 ENCOUNTER — Ambulatory Visit (INDEPENDENT_AMBULATORY_CARE_PROVIDER_SITE_OTHER): Payer: BC Managed Care – PPO

## 2022-01-13 ENCOUNTER — Ambulatory Visit
Admission: EM | Admit: 2022-01-13 | Discharge: 2022-01-13 | Disposition: A | Discharge status: Home / Self Care | Payer: BC Managed Care – PPO | Attending: Family Medicine | Admitting: Family Medicine

## 2022-01-13 DIAGNOSIS — M79644 Pain in right finger(s): Secondary | ICD-10-CM

## 2022-01-13 DIAGNOSIS — M778 Other enthesopathies, not elsewhere classified: Secondary | ICD-10-CM | POA: Diagnosis not present

## 2022-01-13 MED ORDER — PREDNISONE 10 MG PO TABS: ORAL_TABLET | ORAL | 0 refills | Status: DC

## 2022-01-13 NOTE — ED Triage Notes (Signed)
Swelling to right thumb x 1 month.  No known injury.  States there is a popping sound when moving thumb.

## 2022-01-13 NOTE — ED Provider Notes (Signed)
[facility]    CSN: [id number] Arrival date & time: 01/13/22  1634      History   Chief Complaint No chief complaint on file.   HPI Christine Kidd is a 52 y.o. female.   Swelling to right thumb x 1 month.  No known injury.  States there is a popping sound when moving thumb.      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   IBS (irritable bowel syndrome) 04/03/2013   GERD (gastroesophageal reflux disease) 01/16/2013   Essential hypertension, benign 11/20/2012    Past Surgical History:  Procedure Laterality Date   BILATERAL SALPINGECTOMY Bilateral 07/06/2016   Procedure: BILATERAL SALPINGECTOMY;  Surgeon: Jonnie Kind, MD;  Location: [facility];  Service: Gynecology;  Laterality: Bilateral;   COLONOSCOPY N/A 11/30/2012   Procedure: COLONOSCOPY;  Surgeon: Rogene Houston, MD;  [facility];  Service: Endoscopy;  Laterality: N/A;  830   OOPHORECTOMY Right 07/06/2016   Procedure: RIGHT OOPHORECTOMY;  Surgeon: Jonnie Kind, MD;  Location: [facility];  Service: Gynecology;  Laterality: Right;   SUPRACERVICAL ABDOMINAL HYSTERECTOMY N/A 07/06/2016   Procedure: HYSTERECTOMY SUPRACERVICAL ABDOMINAL;  Surgeon: Jonnie Kind, MD;  Location: [facility];  Service: Gynecology;  Laterality: N/A;    OB History     Gravida  2   Para  2   Term  2   Preterm      AB      Living  2      SAB      IAB      Ectopic      Multiple      Live Births  2             Home Medications    Prior to Admission medications   Medication Sig Start Date End Date Taking? Authorizing Provider  predniSONE (DELTASONE) 10 MG tablet Take 6 tabs day one, 5 tabs day two, 4 tabs day three, etc 01/13/22  Yes Volney American, PA-C  aspirin EC 81 MG tablet Take 81 mg by mouth daily.    [provider]  atorvastatin (LIPITOR) 40 MG tablet Take 40 mg by mouth daily. 09/22/21   [provider]  cetirizine (ZYRTEC) 10 MG tablet Take 10 mg by mouth daily as needed for allergies.    [provider]  ferrous sulfate 325 (65 FE) MG EC tablet Take 325 mg by mouth 3 (three) times daily with meals.    [provider]  isosorbide mononitrate (IMDUR) 30 MG 24 hr tablet Take 30 mg by mouth daily. 09/22/21   [provider]  metoprolol tartrate (LOPRESSOR) 25 MG tablet Take 25 mg by mouth daily. 09/22/21   [provider]  ticagrelor (BRILINTA) 90 MG TABS tablet Take by mouth. 09/22/21   [provider]  Wheat Dextrin (BENEFIBER DRINK MIX PO) Take by mouth 2 (two) times daily as needed (constipated). Teaspoonful    [provider]  Family History Family History  Problem Relation Age of Onset   Other Mother        overdose   Drug abuse Mother    Early death Mother 15   Hypertension Father    Hyperlipidemia Father    Cancer Paternal Grandmother        stomach   Dementia Maternal Grandmother    Cancer Paternal Aunt        breast   Colon cancer Neg Hx     Social History Social History   Tobacco Use   Smoking status: Never   Smokeless tobacco: Never  Vaping Use   Vaping Use: Never used  Substance Use Topics   Alcohol use: Yes    Comment: rare wine   Drug use: No     Allergies   Latex   Review of Systems Review of Systems PER HPI  Physical Exam Triage Vital Signs ED Triage Vitals  Enc Vitals Group     BP 01/13/22 1640 (!) 165/94     Pulse Rate 01/13/22 1640 71     Resp 01/13/22  1640 18     Temp 01/13/22 1640 98 F (36.7 C)     Temp Source 01/13/22 1640 Oral     SpO2 01/13/22 1640 98 %     Weight --      Height --      Head Circumference --      Peak Flow --      Pain Score 01/13/22 1641 8     Pain Loc --      Pain Edu? --      Excl. in [place]? --    No data found.  Updated Vital Signs BP (!) 165/94 (BP Location: Right Arm)   Pulse 71   Temp 98 F (36.7 C) (Oral)   Resp 18   LMP 06/30/2016 Comment: postop hysterectomy  SpO2 98%   Visual Acuity Right Eye Distance:   Left Eye Distance:   Bilateral Distance:    Right Eye Near:   Left Eye Near:    Bilateral Near:     Physical Exam Vitals and nursing note reviewed.  Constitutional:      Appearance: Normal appearance. She is not ill-appearing.  HENT:     Head: Atraumatic.  Eyes:     Extraocular Movements: Extraocular movements intact.     Conjunctiva/sclera: Conjunctivae normal.  Cardiovascular:     Rate and Rhythm: Normal rate and regular rhythm.     Heart sounds: Normal heart sounds.  Pulmonary:     Effort: Pulmonary effort is normal.     Breath sounds: Normal breath sounds.  Musculoskeletal:        General: Tenderness present. No swelling, deformity or signs of injury. Normal range of motion.     Cervical back: Normal range of motion and neck supple.     Comments: Tender to palpation at base of right thumb, tender with range of motion but range of motion appears intact  Skin:    General: Skin is warm and dry.     Findings: No bruising or erythema.  Neurological:     Mental Status: She is alert and oriented to person, place, and time.     Motor: No weakness.     Gait: Gait normal.     Comments: RUE neurovascularly intact  Psychiatric:        Mood and Affect: Mood normal.        Thought Content: Thought content normal.  Judgment: Judgment normal.      UC Treatments / Results  Labs (all labs ordered are listed, but only abnormal results are displayed) Labs Reviewed - No  data to display  EKG   Radiology DG Finger Thumb Right  Result Date: 01/13/2022 CLINICAL DATA:  Pain and swelling.  No known injury. EXAM: RIGHT THUMB 2+V COMPARISON:  None Available. FINDINGS: There is mild diffuse soft tissue edema about the thumb. No underlying fracture or dislocation. No significant arthropathy.No radiopaque foreign bodies. IMPRESSION: Soft tissue edema. No acute bone abnormality. Electronically Signed   By: Kerby Moors M.D.   On: 01/13/2022 17:14    Procedures Procedures (including critical care time)  Medications Ordered in UC Medications - No data to display  Initial Impression / Assessment and Plan / UC Course  I have reviewed the triage vital signs and the nursing notes.  Pertinent labs & imaging results that were available during my care of the patient were reviewed by me and considered in my medical decision making (see chart for details).     X-ray of the right thumb showing no acute bony abnormality.  Suspected tendinitis of this area.  Treat with prednisone, RICE protocol and follow-up with orthopedics if not resolving.  Final Clinical Impressions(s) / UC Diagnoses   Final diagnoses:  Right hand tendonitis   Discharge Instructions   None    ED Prescriptions     Medication Sig Dispense Auth. Provider   predniSONE (DELTASONE) 10 MG tablet Take 6 tabs day one, 5 tabs day two, 4 tabs day three, etc 21 tablet Volney American, [place]      PDMP not reviewed this encounter.   Volney American, [place] 01/13/22 1735

## 2022-01-27 ENCOUNTER — Telehealth: Payer: BC Managed Care – PPO | Admitting: Physician Assistant

## 2022-01-27 DIAGNOSIS — S8012XA Contusion of left lower leg, initial encounter: Secondary | ICD-10-CM

## 2022-01-27 NOTE — Patient Instructions (Signed)
Christine Kidd, thank you for joining Christine Rio, PA-C for today's virtual visit.  While this provider is not your primary care provider (PCP), if your PCP is located in our provider database this encounter information will be shared with them immediately following your visit.   [facility] account gives you access to today's visit and all your visits, tests, and labs performed at [facility] " click here if you don't have a [facility] account or go to [email or web address]  Consent: (Patient) Christine Kidd provided verbal consent for this virtual visit at the beginning of the encounter.  Current Medications:  Current Outpatient Medications:    aspirin EC 81 MG tablet, Take 81 mg by mouth daily., Disp: , Rfl:    atorvastatin (LIPITOR) 40 MG tablet, Take 40 mg by mouth daily., Disp: , Rfl:    cetirizine (ZYRTEC) 10 MG tablet, Take 10 mg by mouth daily as needed for allergies., Disp: , Rfl:    ferrous sulfate 325 (65 FE) MG EC tablet, Take 325 mg by mouth 3 (three) times daily with meals., Disp: , Rfl:    isosorbide mononitrate (IMDUR) 30 MG 24 hr tablet, Take 30 mg by mouth daily., Disp: , Rfl:    metoprolol tartrate (LOPRESSOR) 25 MG tablet, Take 25 mg by mouth daily., Disp: , Rfl:    predniSONE (DELTASONE) 10 MG tablet, Take 6 tabs day one, 5 tabs day two, 4 tabs day three, etc, Disp: 21 tablet, Rfl: 0   ticagrelor (BRILINTA) 90 MG TABS tablet, Take by mouth., Disp: , Rfl:    Wheat Dextrin (BENEFIBER DRINK MIX PO), Take by mouth 2 (two) times daily as needed (constipated). Teaspoonful, Disp: , Rfl:    Medications ordered in this encounter:  No orders of the defined types were placed in this encounter.    *If you need refills on other medications prior to your next appointment, please contact your pharmacy*  Follow-Up: Call back or seek an in-person evaluation if the symptoms worsen or if the condition fails to improve as  anticipated.  [name] [phone]  Other Instructions  Hematoma A hematoma is a collection of blood. A hematoma can happen: Under the skin. In an organ. In a body space. In a joint space. In other tissues. The blood can thicken (clot) to form a lump that you can see and feel. The lump is often hard and may become sore and tender. The lump can be very small or very big. Most hematomas get better in a few days to weeks. However, some of these may be serious and need medical care. What are the causes? This condition is caused by: An injury. Blood that leaks under the skin. Problems from surgeries. Medical conditions that cause bleeding or bruising. What increases the risk? You are more likely to develop this condition if: You are an older adult. You use medicines that thin your blood. You use NSAIDs, such as ibuprofen, often for pain. You play contact sports. What are the signs or symptoms? Symptoms depend on where the hematoma is in your body. If the hematoma is under the skin, there is: A firm lump on the body. Pain and tenderness in the area. Bruising. The skin above the lump may be blue, dark blue, purple-red, or yellowish. If the hematoma is deep in the tissues or body spaces, there may be: Blood in the stomach. This may cause pain in the belly (abdomen), weakness, passing out (fainting), and  shortness of breath. Blood in the head. This may cause a headache, weakness, trouble speaking or understanding speech, or passing out. How is this treated? Treatment depends on the cause, size, and location of the hematoma. Treatment may include: Doing nothing. Many hematomas go away on their own without treatment. Surgery or close monitoring. This may be needed for large hematomas or hematomas that affect the body's organs. Medicines. These may be given if a medical condition caused the hematoma. Follow these instructions at home: Managing pain, stiffness, and  swelling  If told, put ice on the injured area. To do this: Put ice in a plastic bag. Place a towel between your skin and the bag. Leave the ice on for 20 minutes, 2-3 times a day for the first two days. If your skin turns bright red, take off the ice right away to prevent skin damage. The risk of skin damage is higher if you cannot feel pain, heat, or cold. If told, put heat on the injured area. Do this as often as told by your doctor. Use the heat source that your doctor recommends, such as a moist heat pack or a heating pad. Place a towel between your skin and the heat source. Leave the heat on for 20-30 minutes. If your skin turns bright red, take off the heat right away to prevent burns. The risk of burns is higher if you cannot feel pain, heat, or cold. Raise the injured area above the level of your heart while you are sitting or lying down. Wrap the affected area with an elastic bandage, if told by your doctor. Do not wrap the bandage too tight. If your hematoma is on a leg or foot and is painful, your doctor may give you crutches. Use them as told by your doctor. General instructions Take over-the-counter and prescription medicines only as told by your doctor. Keep all follow-up visits. Your doctor may want to see how your hematoma is healing with treatment. Contact a doctor if: You have a fever. The swelling or bruising gets worse. You start to get more hematomas. Your pain gets worse. Your pain is not getting better with medicine. The skin over the hematoma breaks or starts to bleed. Get help right away if: Your hematoma is in your chest or belly and you: Pass out. Feel weak. Become short of breath. You have a hematoma on your scalp that is caused by a fall or injury, and you: Have a headache that gets worse. Have trouble speaking or understanding speech. Become less alert or you pass out. These symptoms may be an emergency. Get help right away. Call 911. Do not wait to  see if the symptoms will go away. Do not drive yourself to the hospital This information is not intended to replace advice given to you by your health care provider. Make sure you discuss any questions you have with your health care provider. Document Revised: 06/22/2021 Document Reviewed: 06/22/2021 Elsevier Patient Education  [place].    If you have been instructed to have an in-person evaluation today at a local Urgent Care facility, please use the link below. It will take you to a list of all of our available [facility], including address, phone number and hours of operation. Please do not delay care.  [facility]  If you or a family member do not have a primary care provider, use the link below to schedule a visit and establish care. When you choose a  [facility] primary care physician or advanced practice provider, you gain a long-term partner in health. Find a Primary Care Provider  Learn more about 's in-office and virtual care options: [facility]
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
## 2022-03-26 ENCOUNTER — Encounter: Payer: Self-pay | Admitting: Family Medicine

## 2022-03-26 ENCOUNTER — Ambulatory Visit (INDEPENDENT_AMBULATORY_CARE_PROVIDER_SITE_OTHER): Payer: BC Managed Care – PPO | Admitting: Family Medicine

## 2022-03-26 VITALS — BP 149/99 | HR 60 | Temp 97.8°F | Resp 16 | Ht 63.0 in | Wt 164.4 lb

## 2022-03-26 DIAGNOSIS — Z23 Encounter for immunization: Secondary | ICD-10-CM | POA: Diagnosis not present

## 2022-03-26 DIAGNOSIS — Z1231 Encounter for screening mammogram for malignant neoplasm of breast: Secondary | ICD-10-CM | POA: Diagnosis not present

## 2022-03-26 DIAGNOSIS — E559 Vitamin D deficiency, unspecified: Secondary | ICD-10-CM

## 2022-03-26 DIAGNOSIS — Z0000 Encounter for general adult medical examination without abnormal findings: Secondary | ICD-10-CM

## 2022-03-26 DIAGNOSIS — I10 Essential (primary) hypertension: Secondary | ICD-10-CM | POA: Diagnosis not present

## 2022-03-26 DIAGNOSIS — E538 Deficiency of other specified B group vitamins: Secondary | ICD-10-CM | POA: Diagnosis not present

## 2022-03-26 LAB — COMPREHENSIVE METABOLIC PANEL
ALT: 28 U/L (ref 0–35)
AST: 27 U/L (ref 0–37)
Albumin: 4.5 g/dL (ref 3.5–5.2)
Alkaline Phosphatase: 55 U/L (ref 39–117)
BUN: 13 mg/dL (ref 6–23)
CO2: 31 mEq/L (ref 19–32)
Calcium: 9.9 mg/dL (ref 8.4–10.5)
Chloride: 100 mEq/L (ref 96–112)
Creatinine, Ser: 0.88 mg/dL (ref 0.40–1.20)
GFR: 74.85 mL/min (ref >60.00)
Glucose, Bld: 100 mg/dL — ABNORMAL HIGH (ref 70–99)
Potassium: 4.5 mEq/L (ref 3.5–5.1)
Sodium: 139 mEq/L (ref 135–145)
Total Bilirubin: 0.6 mg/dL (ref 0.2–1.2)
Total Protein: 7.6 g/dL (ref 6.0–8.3)

## 2022-03-26 LAB — LIPID PANEL
Cholesterol: 158 mg/dL (ref 0–200)
HDL: 62.4 mg/dL (ref >39.00)
LDL Cholesterol: 76 mg/dL (ref 0–99)
NonHDL: 96.07
Total CHOL/HDL Ratio: 3
Triglycerides: 100 mg/dL (ref 0.0–149.0)
VLDL: 20 mg/dL (ref 0.0–40.0)

## 2022-03-26 LAB — TSH: TSH: 0.98 u[IU]/mL (ref 0.35–5.50)

## 2022-03-26 LAB — VITAMIN D 25 HYDROXY (VIT D DEFICIENCY, FRACTURES): VITD: 25.02 ng/mL — ABNORMAL LOW (ref 30.00–100.00)

## 2022-03-26 LAB — VITAMIN B12: Vitamin B-12: 1236 pg/mL — ABNORMAL HIGH (ref 211–911)

## 2022-03-26 NOTE — Patient Instructions (Addendum)
Check BP every day for 2 weeks, then message me the readings on my chart.   Ask dr. Erlinda Hong about the extreme fatigue with the metoprolol-- ask to replace this medication with nifedipine instead-- ask this thoughts.  Health Maintenance, Female Adopting a healthy lifestyle and getting preventive care are important in promoting health and wellness. Ask your health care provider about: The right schedule for you to have regular tests and exams. Things you can do on your own to prevent diseases and keep yourself healthy. What should I know about diet, weight, and exercise? Eat a healthy diet  Eat a diet that includes plenty of vegetables, fruits, low-fat dairy products, and lean protein. Do not eat a lot of foods that are high in solid fats, added sugars, or sodium. Maintain a healthy weight Body mass index (BMI) is used to identify weight problems. It estimates body fat based on height and weight. Your health care provider can help determine your BMI and help you achieve or maintain a healthy weight. Get regular exercise Get regular exercise. This is one of the most important things you can do for your health. Most adults should: Exercise for at least 150 minutes each week. The exercise should increase your heart rate and make you sweat (moderate-intensity exercise). Do strengthening exercises at least twice a week. This is in addition to the moderate-intensity exercise. Spend less time sitting. Even light physical activity can be beneficial. Watch cholesterol and blood lipids Have your blood tested for lipids and cholesterol at 54 years of age, then have this test every 5 years. Have your cholesterol levels checked more often if: Your lipid or cholesterol levels are high. You are older than 54 years of age. You are at high risk for heart disease. What should I know about cancer screening? Depending on your health history and family history, you may need to have cancer screening at various ages.  This may include screening for: Breast cancer. Cervical cancer. Colorectal cancer. Skin cancer. Lung cancer. What should I know about heart disease, diabetes, and high blood pressure? Blood pressure and heart disease High blood pressure causes heart disease and increases the risk of stroke. This is more likely to develop in people who have high blood pressure readings or are overweight. Have your blood pressure checked: Every 3-5 years if you are 52-33 years of age. Every year if you are 47 years old or older. Diabetes Have regular diabetes screenings. This checks your fasting blood sugar level. Have the screening done: Once every three years after age 50 if you are at a normal weight and have a low risk for diabetes. More often and at a younger age if you are overweight or have a high risk for diabetes. What should I know about preventing infection? Hepatitis B If you have a higher risk for hepatitis B, you should be screened for this virus. Talk with your health care provider to find out if you are at risk for hepatitis B infection. Hepatitis C Testing is recommended for: Everyone born from [birth year] through 1967. Anyone with known risk factors for hepatitis C. Sexually transmitted infections (STIs) Get screened for STIs, including gonorrhea and chlamydia, if: You are sexually active and are younger than 54 years of age. You are older than 54 years of age and your health care provider tells you that you are at risk for this type of infection. Your sexual activity has changed since you were last screened, and you are at increased risk for chlamydia  or gonorrhea. Ask your health care provider if you are at risk. Ask your health care provider about whether you are at high risk for HIV. Your health care provider may recommend a prescription medicine to help prevent HIV infection. If you choose to take medicine to prevent HIV, you should first get tested for HIV. You should then be tested every 3  months for as long as you are taking the medicine. Pregnancy If you are about to stop having your period (premenopausal) and you may become pregnant, seek counseling before you get pregnant. Take 400 to 800 micrograms (mcg) of folic acid every day if you become pregnant. Ask for birth control (contraception) if you want to prevent pregnancy. Osteoporosis and menopause Osteoporosis is a disease in which the bones lose minerals and strength with aging. This can result in bone fractures. If you are 70 years old or older, or if you are at risk for osteoporosis and fractures, ask your health care provider if you should: Be screened for bone loss. Take a calcium or vitamin D supplement to lower your risk of fractures. Be given hormone replacement therapy (HRT) to treat symptoms of menopause. Follow these instructions at home: Alcohol use Do not drink alcohol if: Your health care provider tells you not to drink. You are pregnant, may be pregnant, or are planning to become pregnant. If you drink alcohol: Limit how much you have to: 0-1 drink a day. Know how much alcohol is in your drink. In the [place] one drink equals one 12 oz bottle of beer (355 mL), one 5 oz glass of wine (148 mL), or one 1 oz glass of hard liquor (44 mL). Lifestyle Do not use any products that contain nicotine or tobacco. These products include cigarettes, chewing tobacco, and vaping devices, such as e-cigarettes. If you need help quitting, ask your health care provider. Do not use street drugs. Do not share needles. Ask your health care provider for help if you need support or information about quitting drugs. General instructions Schedule regular health, dental, and eye exams. Stay current with your vaccines. Tell your health care provider if: You often feel depressed. You have ever been abused or do not feel safe at home. Summary Adopting a healthy lifestyle and getting preventive care are important in promoting health  and wellness. Follow your health care provider's instructions about healthy diet, exercising, and getting tested or screened for diseases. Follow your health care provider's instructions on monitoring your cholesterol and blood pressure. This information is not intended to replace advice given to you by your health care provider. Make sure you discuss any questions you have with your health care provider. Document Revised: 05/19/2020 Document Reviewed: 05/19/2020 Elsevier Patient Education  [place].

## 2022-03-26 NOTE — Progress Notes (Signed)
Complete physical exam  Patient: Christine Kidd   DOB: 1970/09/13   52 y.o. Female  MRN: [id number]  Subjective:    Chief Complaint  Patient presents with   Annual Exam    fasting   Health Maintenance    Due for second shingrix, will get in office today    Christine Kidd is a 52 y.o. female who presents today for a complete physical exam. She reports consuming a general, reduced fat, low salt diet. Home exercise routine includes walking twice a week. She generally feels well. She reports sleeping fairly well. She does not have additional problems to discuss today.    Most recent fall risk assessment:    03/26/2022    8:07 AM  [place] in the past year? 0  Number falls in past yr: 0  Injury with Fall? 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   atorvastatin (LIPITOR) 40 MG tablet Take 40 mg by mouth daily.   cetirizine (ZYRTEC) 10 MG tablet Take 10 mg by mouth daily as needed for allergies.   ferrous sulfate 325 (65 FE) MG EC  tablet Take 325 mg by mouth 3 (three) times daily with meals.   hydrochlorothiazide (HYDRODIURIL) 12.5 MG tablet Take 12.5 mg by mouth daily.   isosorbide mononitrate (IMDUR) 30 MG 24 hr tablet Take 30 mg by mouth in the morning and at bedtime.   metoprolol tartrate (LOPRESSOR) 25 MG tablet Take 25 mg by mouth daily.   ticagrelor (BRILINTA) 90 MG TABS tablet Take by mouth.   Wheat Dextrin (BENEFIBER DRINK MIX PO) Take by mouth 2 (two) times daily as needed (constipated). Teaspoonful   No facility-administered medications prior to visit.    Review of Systems  HENT:  Negative for hearing loss.   Eyes:  Negative for blurred vision.  Respiratory:  Negative for shortness of breath.   Cardiovascular:  Negative for chest pain.  Gastrointestinal: Negative.   Genitourinary: Negative.   Musculoskeletal:  Negative for back pain.  Neurological:  Negative for headaches.  Psychiatric/Behavioral:  Negative for depression.           Objective:     BP (!) 149/99 (BP Location: Right Arm, Patient Position: Sitting, Cuff Size: Normal)   Pulse 60   Temp 97.8 F (36.6 C) (Oral)   Resp 16   Ht 5\' 3"  (1.6 m)   Wt 164 lb 6.4 oz (74.6 kg)   LMP 06/30/2016 Comment: postop  hysterectomy  SpO2 97%   BMI 29.12 kg/m    Physical Exam Vitals reviewed.  Constitutional:      Appearance: Normal appearance. She is well-groomed and normal weight.  HENT:     Right Ear: Tympanic membrane normal.     Left Ear: Tympanic membrane normal.     Mouth/Throat:     Mouth: Mucous membranes are moist.     Pharynx: No posterior oropharyngeal erythema.  Eyes:     Conjunctiva/sclera: Conjunctivae normal.  Neck:     Thyroid: No thyromegaly.  Cardiovascular:     Rate and Rhythm: Normal rate and regular rhythm.     Pulses: Normal pulses.     Heart sounds: S1 normal and S2 normal.  Pulmonary:     Effort: Pulmonary effort is normal.     Breath sounds: Normal breath sounds and air entry.  Abdominal:     General:  Bowel sounds are normal.  Musculoskeletal:     Cervical back: Normal range of motion.     Right lower leg: No edema.     Left lower leg: No edema.  Lymphadenopathy:     Cervical: No cervical adenopathy.  Neurological:     Mental Status: She is alert and oriented to person, place, and time. Mental status is at baseline.     Gait: Gait is intact.  Psychiatric:        Mood and Affect: Mood and affect normal.        Speech: Speech normal.        Behavior: Behavior normal.        Judgment: Judgment normal.      No results found for any visits on 03/26/22.     Assessment & Plan:    Routine Health Maintenance and Physical Exam  Immunization History  Administered Date(s) Administered   Hepatitis B, ADULT 11/22/2018, 01/22/2019   Influenza,inj,Quad PF,6+ Mos 11/02/2017, 11/01/2018, 09/25/2021   Influenza-Unspecified 10/25/2016   Moderna Sars-Covid-2 Vaccination 10/12/2019   PFIZER(Purple Top)SARS-COV-2 Vaccination 03/18/2019, 04/08/2019   Tdap 11/02/2017   Zoster Recombinat (Shingrix) 06/25/2020, 03/26/2022    Health Maintenance  Topic Date Due   MAMMOGRAM  03/26/2022 (Originally 06/23/2018)   COVID-19 Vaccine (4 - 2023-24 season) 04/11/2022 (Originally 09/11/2021)   COLONOSCOPY (Pts 45-44yrs Insurance coverage will need to be confirmed)  12/01/2022   PAP SMEAR-Modifier  06/26/2023   DTaP/Tdap/Td (2 - Td or Tdap) 11/03/2027   INFLUENZA VACCINE  Completed   Hepatitis C Screening  Completed   HIV Screening  Completed   Zoster Vaccines- Shingrix  Completed   HPV VACCINES  Aged Out    Discussed health benefits of physical activity, and encouraged her to engage in regular exercise appropriate for her age and condition.  Problem List Items Addressed This Visit       Unprioritized   Essential hypertension, benign   Relevant Medications   hydrochlorothiazide (HYDRODIURIL) 12.5 MG tablet   Other Relevant Orders   Lipid Panel   TSH   CMP   B12 deficiency   Relevant Orders    Vitamin B12   Vitamin D deficiency   Relevant Orders   Vitamin D, 25-hydroxy   Other Visit Diagnoses     Breast cancer screening by mammogram    -  Primary   Relevant Orders   MM Digital Screening   Preventative health care       Immunization due       Relevant Orders   Zoster Recombinant (Shingrix ) (Completed)  Physical exam findings are normal. I gave education about sleep hygiene and healthy eating handouts were given. Annual lab work was ordered. Mammogram ordered. Return in 6 months (on 09/26/2022).     Farrel Conners, MD

## 2022-05-19 ENCOUNTER — Telehealth: Payer: BC Managed Care – PPO | Admitting: Physician Assistant

## 2022-05-19 DIAGNOSIS — J069 Acute upper respiratory infection, unspecified: Secondary | ICD-10-CM | POA: Diagnosis not present

## 2022-05-20 MED ORDER — FLUTICASONE PROPIONATE 50 MCG/ACT NA SUSP: 2.0000 | Freq: Every day | NASAL | 0 refills | Status: DC

## 2022-05-20 NOTE — Progress Notes (Signed)

## 2022-05-20 NOTE — Progress Notes (Signed)
I have spent 5 minutes in review of e-visit questionnaire, review and updating patient chart, medical decision making and response to patient.   Priya Matsen Cody Dominque Marlin, PA-C    

## 2022-06-02 ENCOUNTER — Telehealth: Payer: Self-pay | Admitting: *Deleted

## 2022-06-02 NOTE — Telephone Encounter (Signed)
Patient was noted on a list given to me by clinical supervisor for lack of recent mammogram results. Unable to leave a message at the patient's cell number due to voicemail not being set up.

## 2022-08-19 ENCOUNTER — Ambulatory Visit: Payer: BC Managed Care – PPO

## 2022-08-26 ENCOUNTER — Encounter: Payer: Self-pay | Admitting: Physician Assistant

## 2022-08-26 ENCOUNTER — Encounter (INDEPENDENT_AMBULATORY_CARE_PROVIDER_SITE_OTHER): Payer: Self-pay

## 2022-08-26 ENCOUNTER — Telehealth: Payer: BC Managed Care – PPO | Admitting: Physician Assistant

## 2022-08-26 ENCOUNTER — Telehealth: Payer: Self-pay

## 2022-08-26 DIAGNOSIS — U071 COVID-19: Secondary | ICD-10-CM

## 2022-08-26 MED ORDER — MOLNUPIRAVIR EUA 200MG CAPSULE: 4.0000 | ORAL_CAPSULE | Freq: Two times a day (BID) | ORAL | 0 refills | Status: DC

## 2022-08-26 MED ORDER — NIRMATRELVIR/RITONAVIR (PAXLOVID)TABLET: 3.0000 | ORAL_TABLET | Freq: Two times a day (BID) | ORAL | 0 refills | Status: AC

## 2022-08-26 NOTE — Progress Notes (Signed)
Virtual Visit Consent   Christine Kidd, you are scheduled for a virtual visit with a Christine Kidd provider today. Just as with appointments in the office, your consent must be obtained to participate. Your consent will be active for this visit and any virtual visit you may have with one of our providers in the next 365 days. If you have a MyChart account, a copy of this consent can be sent to you electronically.  As this is a virtual visit, video technology does not allow for your provider to perform a traditional examination. This may limit your provider's ability to fully assess your condition. If your provider identifies any concerns that need to be evaluated in person or the need to arrange testing (such as labs, EKG, etc.), we will make arrangements to do so. Although advances in technology are sophisticated, we cannot ensure that it will always work on either your end or our end. If the connection with a video visit is poor, the visit may have to be switched to a telephone visit. With either a video or telephone visit, we are not always able to ensure that we have a secure connection.  By engaging in this virtual visit, you consent to the provision of healthcare and authorize for your insurance to be billed (if applicable) for the services provided during this visit. Depending on your insurance coverage, you may receive a charge related to this service.  I need to obtain your verbal consent now. Are you willing to proceed with your visit today? [name] has provided verbal consent on 08/26/2022 for a virtual visit (video or telephone). Margaretann Loveless, PA-C  Date: 08/26/2022 9:44 AM  Virtual Visit via Video Note   I, Margaretann Loveless, connected with  Christine Kidd  ([id number], February 29, 1970) on 08/26/22 at  9:45 AM EDT by a video-enabled telemedicine application and verified that I am speaking with the correct person using two  identifiers.  Location: Patient: Virtual Visit Location Patient: Home Provider: Virtual Visit Location Provider: Home Office   I discussed the limitations of evaluation and management by telemedicine and the availability of in person appointments. The patient expressed understanding and agreed to proceed.    History of Present Illness: Christine Kidd is a 52 y.o. who identifies as a female who was assigned female at birth, and is being seen today for Covid 16.  HPI: URI  This is a new problem. The current episode started yesterday (Tested positive for Covid 19; Symptoms started last night). The problem has been gradually worsening. There has been no fever. Associated symptoms include congestion, coughing, headaches, nausea (mild), a plugged ear sensation, rhinorrhea (post nasal drainage), sinus pain, sneezing and a sore throat (scratchy). Pertinent negatives include no diarrhea, ear pain, vomiting or wheezing. She has tried nothing for the symptoms. The treatment provided no relief.     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   IBS (irritable bowel syndrome) 04/03/2013   GERD (gastroesophageal reflux disease) 01/16/2013   Essential hypertension, benign 11/20/2012    Allergies:  Allergies  Allergen Reactions   Latex Rash   Medications:  Current Outpatient Medications:    molnupiravir EUA (LAGEVRIO) 200 mg CAPS capsule, Take 4 capsules (800 mg total) by mouth  2 (two) times daily for 5 days., Disp: 40 capsule, Rfl: 0   aspirin EC 81 MG tablet, Take 81 mg by mouth daily., Disp: , Rfl:    atorvastatin (LIPITOR) 40 MG tablet, Take 40 mg by mouth daily., Disp: , Rfl:    cetirizine (ZYRTEC) 10 MG tablet, Take 10 mg  by mouth daily as needed for allergies., Disp: , Rfl:    ferrous sulfate 325 (65 FE) MG EC tablet, Take 325 mg by mouth 3 (three) times daily with meals., Disp: , Rfl:    fluticasone (FLONASE) 50 MCG/ACT nasal spray, Place 2 sprays into both nostrils daily., Disp: 16 g, Rfl: 0   hydrochlorothiazide (HYDRODIURIL) 12.5 MG tablet, Take 12.5 mg by mouth daily., Disp: , Rfl:    isosorbide mononitrate (IMDUR) 30 MG 24 hr tablet, Take 30 mg by mouth in the morning and at bedtime., Disp: , Rfl:    metoprolol tartrate (LOPRESSOR) 25 MG tablet, Take 25 mg by mouth daily., Disp: , Rfl:    ticagrelor (BRILINTA) 90 MG TABS tablet, Take by mouth., Disp: , Rfl:    Wheat Dextrin (BENEFIBER DRINK MIX PO), Take by mouth 2 (two) times daily as needed (constipated). Teaspoonful, Disp: , Rfl:   Observations/Objective: Patient is well-developed, well-nourished in no acute distress.  Resting comfortably at home.  Head is normocephalic, atraumatic.  No labored breathing.  Speech is clear and coherent with logical content.  Patient is alert and oriented at baseline.    Assessment and Plan: 1. COVID-19 - molnupiravir EUA (LAGEVRIO) 200 mg CAPS capsule; Take 4 capsules (800 mg total) by mouth 2 (two) times daily for 5 days.  Dispense: 40 capsule; Refill: 0 - MyChart COVID-19 home monitoring program; Future  - Continue OTC symptomatic management of choice - Will send OTC vitamins and supplement information through AVS - Molnupiravir prescribed, patient on Brilinta - Patient enrolled in MyChart symptom monitoring - Push fluids - Rest as needed - Discussed return precautions and when to seek in-person evaluation, sent via AVS as well   Follow Up Instructions: I discussed the assessment and treatment plan with the patient. The patient was provided an opportunity to ask questions and all were answered. The patient agreed with the plan and demonstrated an understanding of the instructions.  A copy of instructions  were sent to the patient via MyChart unless otherwise noted below.    The patient was advised to call back or seek an in-person evaluation if the symptoms worsen or if the condition fails to improve as anticipated.  Time:  I spent 10 minutes with the patient via telehealth technology discussing the above problems/concerns.    Margaretann Loveless, PA-C

## 2022-08-26 NOTE — Telephone Encounter (Signed)
Called patient in regard to COVID survey filled out in [place]. No answer, sent care advice to mychart.

## 2022-08-26 NOTE — Patient Instructions (Signed)
Brynnan Tama Gander, thank you for joining Margaretann Loveless, PA-C for today's virtual visit.  While this provider is not your primary care provider (PCP), if your PCP is located in our provider database this encounter information will be shared with them immediately following your visit.   A [facility] MyChart account gives you access to today's visit and all your visits, tests, and labs performed at [facility] " click here if you don't have a [facility] MyChart account or go to [email or web address]  Consent: (Patient) Christine Kidd provided verbal consent for this virtual visit at the beginning of the encounter.  Current Medications:  Current Outpatient Medications:    molnupiravir EUA (LAGEVRIO) 200 mg CAPS capsule, Take 4 capsules (800 mg total) by mouth 2 (two) times daily for 5 days., Disp: 40 capsule, Rfl: 0   aspirin EC 81 MG tablet, Take 81 mg by mouth daily., Disp: , Rfl:    atorvastatin (LIPITOR) 40 MG tablet, Take 40 mg by mouth daily., Disp: , Rfl:    cetirizine (ZYRTEC) 10 MG tablet, Take 10 mg by mouth daily as needed for allergies., Disp: , Rfl:    ferrous sulfate 325 (65 FE) MG EC tablet, Take 325 mg by mouth 3 (three) times daily with meals., Disp: , Rfl:    fluticasone (FLONASE) 50 MCG/ACT nasal spray, Place 2 sprays into both nostrils daily., Disp: 16 g, Rfl: 0   hydrochlorothiazide (HYDRODIURIL) 12.5 MG tablet, Take 12.5 mg by mouth daily., Disp: , Rfl:    isosorbide mononitrate (IMDUR) 30 MG 24 hr tablet, Take 30 mg by mouth in the morning and at bedtime., Disp: , Rfl:    metoprolol tartrate (LOPRESSOR) 25 MG tablet, Take 25 mg by mouth daily., Disp: , Rfl:    ticagrelor (BRILINTA) 90 MG TABS tablet, Take by mouth., Disp: , Rfl:    Wheat Dextrin (BENEFIBER DRINK MIX PO), Take by mouth 2 (two) times daily as needed (constipated). Teaspoonful, Disp: , Rfl:    Medications ordered in this encounter:  Meds ordered this encounter   Medications   molnupiravir EUA (LAGEVRIO) 200 mg CAPS capsule    Sig: Take 4 capsules (800 mg total) by mouth 2 (two) times daily for 5 days.    Dispense:  40 capsule    Refill:  0    Pt on Brilinta    Order Specific Question:   Supervising Provider    Answer:   Merrilee Jansky [[id number]]     *If you need refills on other medications prior to your next appointment, please contact your pharmacy*  Follow-Up: Call back or seek an in-person evaluation if the symptoms worsen or if the condition fails to improve as anticipated.  [facility] [phone]  Care Instructions: Molnupiravir Capsules What is this medication? MOLNUPIRAVIR (MOL nue PIR a vir) treats mild to moderate COVID-19. It may help people who are at high risk of developing severe illness. This medication works by limiting the spread of the virus in your body. The FDA has allowed the emergency use of this medication. This medicine may be used for other purposes; ask your health care provider or pharmacist if you have questions. COMMON BRAND NAME(S): LAGEVRIO What should I tell my care team before I take this medication? They need to know if you have any of these conditions: Any allergies Any serious illness An unusual or allergic reaction to molnupiravir, other medications, foods, dyes, or preservatives Pregnant or trying to get pregnant Breast-feeding How  should I use this medication? Take this medication by mouth with water. Take it as directed on the prescription label at the same time every day. Do not cut, crush, or chew this medication. Swallow the capsules whole. You can take it with or without food. If it upsets your stomach, take it with food. Take all of it unless your care team tells you to stop it early. Keep taking it even if you think you are better. Talk to your care team about the use of this medication in children. Special care may be needed. Overdosage: If you think you have taken too much  of this medicine contact a poison control center or emergency room at once. NOTE: This medicine is only for you. Do not share this medicine with others. What if I miss a dose? If you miss a dose, take it as soon as you can unless it is more than 10 hours late. If it is more than 10 hours late, skip the missed dose. Take the next dose at the normal time. Do not take extra or 2 doses at the same time to make up for the missed dose. What may interact with this medication? Interactions have not been studied. This list may not describe all possible interactions. Give your health care provider a list of all the medicines, herbs, non-prescription drugs, or dietary supplements you use. Also tell them if you smoke, drink alcohol, or use illegal drugs. Some items may interact with your medicine. What should I watch for while using this medication? Your condition will be monitored carefully while you are receiving this medication. Visit your care team for regular checkups. Tell your care team if your symptoms do not start to get better or if they get worse. Do not become pregnant while taking this medication. You may need a pregnancy test before starting this medication. Women must use a reliable form of birth control while taking this medication and for 4 days after stopping the medication. Women should inform their care team if they wish to become pregnant or think they might be pregnant. Men should not father a child while taking this medication and for 3 months after stopping it. There is potential for serious harm to an unborn child. Talk to your care team for more information. Do not breast-feed an infant while taking this medication and for 4 days after stopping the medication. What side effects may I notice from receiving this medication? Side effects that you should report to your care team as soon as possible: Allergic reactions--skin rash, itching, hives, swelling of the face, lips, tongue, or  throat Side effects that usually do not require medical attention (report these to your care team if they continue or are bothersome): Diarrhea Dizziness Nausea This list may not describe all possible side effects. Call your doctor for medical advice about side effects. You may report side effects to FDA at 1-800-FDA-1088. Where should I keep my medication? Keep out of the reach of children and pets. Store at room temperature between 20 and 25 degrees C (68 and 77 degrees F). Get rid of any unused medication after the expiration date. To get rid of medications that are no longer needed or have expired: Take the medication to a medication take-back program. Check with your pharmacy or law enforcement to find a location. If you cannot return the medication, check the label or package insert to see if the medication should be thrown out in the garbage or flushed down the toilet.  If you are not sure, ask your care team. If it is safe to put it in the trash, take the medication out of the container. Mix the medication with cat litter, dirt, coffee grounds, or other unwanted substance. Seal the mixture in a bag or container. Put it in the trash. NOTE: This sheet is a summary. It may not cover all possible information. If you have questions about this medicine, talk to your doctor, pharmacist, or health care provider.  2024 Elsevier/Gold Standard (2021-02-23 00:00:00)    Isolation Instructions: You are to isolate at home until you have been fever free for at least 24 hours without a fever-reducing medication, and symptoms have been steadily improving for 24 hours. At that time,  you can end isolation but need to mask for an additional 5 days.   If you must be around other household members who do not have symptoms, you need to make sure that both you and the family members are masking consistently with a high-quality mask.  If you note any worsening of symptoms despite treatment, please seek an  in-person evaluation ASAP. If you note any significant shortness of breath or any chest pain, please seek ER evaluation. Please do not delay care!   COVID-19: What to Do if You Are Sick If you test positive and are an older adult or someone who is at high risk of getting very sick from COVID-19, treatment may be available. Contact a healthcare provider right away after a positive test to determine if you are eligible, even if your symptoms are mild right now. You can also visit a Test to Treat location and, if eligible, receive a prescription from a provider. Don't delay: Treatment must be started within the first few days to be effective. If you have a fever, cough, or other symptoms, you might have COVID-19. Most people have mild illness and are able to recover at home. If you are sick: Keep track of your symptoms. If you have an emergency warning sign (including trouble breathing), call 911. Steps to help prevent the spread of COVID-19 if you are sick If you are sick with COVID-19 or think you might have COVID-19, follow the steps below to care for yourself and to help protect other people in your home and community. Stay home except to get medical care Stay home. Most people with COVID-19 have mild illness and can recover at home without medical care. Do not leave your home, except to get medical care. Do not visit public areas and do not go to places where you are unable to wear a mask. Take care of yourself. Get rest and stay hydrated. Take over-the-counter medicines, such as acetaminophen, to help you feel better. Stay in touch with your doctor. Call before you get medical care. Be sure to get care if you have trouble breathing, or have any other emergency warning signs, or if you think it is an emergency. Avoid public transportation, ride-sharing, or taxis if possible. Get tested If you have symptoms of COVID-19, get tested. While waiting for test results, stay away from others, including  staying apart from those living in your household. Get tested as soon as possible after your symptoms start. Treatments may be available for people with COVID-19 who are at risk for becoming very sick. Don't delay: Treatment must be started early to be effective--some treatments must begin within 5 days of your first symptoms. Contact your healthcare provider right away if your test result is positive to determine if  you are eligible. Self-tests are one of several options for testing for the virus that causes COVID-19 and may be more convenient than laboratory-based tests and point-of-care tests. Ask your healthcare provider or your local health department if you need help interpreting your test results. You can visit your state, tribal, local, and territorial health department's website to look for the latest local information on testing sites. Separate yourself from other people As much as possible, stay in a specific room and away from other people and pets in your home. If possible, you should use a separate bathroom. If you need to be around other people or animals in or outside of the home, wear a well-fitting mask. Tell your close contacts that they may have been exposed to COVID-19. An infected person can spread COVID-19 starting 48 hours (or 2 days) before the person has any symptoms or tests positive. By letting your close contacts know they may have been exposed to COVID-19, you are helping to protect everyone. See COVID-19 and Animals if you have questions about pets. If you are diagnosed with COVID-19, someone from the health department may call you. Answer the call to slow the spread. Monitor your symptoms Symptoms of COVID-19 include fever, cough, or other symptoms. Follow care instructions from your healthcare provider and local health department. Your local health authorities may give instructions on checking your symptoms and reporting information. When to seek emergency medical  attention Look for emergency warning signs* for COVID-19. If someone is showing any of these signs, seek emergency medical care immediately: Trouble breathing Persistent pain or pressure in the chest New confusion Inability to wake or stay awake Pale, gray, or blue-colored skin, lips, or nail beds, depending on skin tone *This list is not all possible symptoms. Please call your medical provider for any other symptoms that are severe or concerning to you. Call 911 or call ahead to your local emergency facility: Notify the operator that you are seeking care for someone who has or may have COVID-19. Call ahead before visiting your doctor Call ahead. Many medical visits for routine care are being postponed or done by phone or telemedicine. If you have a medical appointment that cannot be postponed, call your doctor's office, and tell them you have or may have COVID-19. This will help the office protect themselves and other patients. If you are sick, wear a well-fitting mask You should wear a mask if you must be around other people or animals, including pets (even at home). Wear a mask with the best fit, protection, and comfort for you. You don't need to wear the mask if you are alone. If you can't put on a mask (because of trouble breathing, for example), cover your coughs and sneezes in some other way. Try to stay at least 6 feet away from other people. This will help protect the people around you. Masks should not be placed on young children under age 51 years, anyone who has trouble breathing, or anyone who is not able to remove the mask without help. Cover your coughs and sneezes Cover your mouth and nose with a tissue when you cough or sneeze. Throw away used tissues in a lined trash can. Immediately wash your hands with soap and water for at least 20 seconds. If soap and water are not available, clean your hands with an alcohol-based hand sanitizer that contains at least 60% alcohol. Clean your  hands often Wash your hands often with soap and water for at least 20 seconds.  This is especially important after blowing your nose, coughing, or sneezing; going to the bathroom; and before eating or preparing food. Use hand sanitizer if soap and water are not available. Use an alcohol-based hand sanitizer with at least 60% alcohol, covering all surfaces of your hands and rubbing them together until they feel dry. Soap and water are the best option, especially if hands are visibly dirty. Avoid touching your eyes, nose, and mouth with unwashed hands. Handwashing Tips Avoid sharing personal household items Do not share dishes, drinking glasses, cups, eating utensils, towels, or bedding with other people in your home. Wash these items thoroughly after using them with soap and water or put in the dishwasher. Clean surfaces in your home regularly Clean and disinfect high-touch surfaces (for example, doorknobs, tables, handles, light switches, and countertops) in your "sick room" and bathroom. In shared spaces, you should clean and disinfect surfaces and items after each use by the person who is ill. If you are sick and cannot clean, a caregiver or other person should only clean and disinfect the area around you (such as your bedroom and bathroom) on an as needed basis. Your caregiver/other person should wait as long as possible (at least several hours) and wear a mask before entering, cleaning, and disinfecting shared spaces that you use. Clean and disinfect areas that may have blood, stool, or body fluids on them. Use household cleaners and disinfectants. Clean visible dirty surfaces with household cleaners containing soap or detergent. Then, use a household disinfectant. Use a product from [employer] List N: Disinfectants for Coronavirus (COVID-19). Be sure to follow the instructions on the label to ensure safe and effective use of the product. Many products recommend keeping the surface wet with a  disinfectant for a certain period of time (look at "contact time" on the product label). You may also need to wear personal protective equipment, such as gloves, depending on the directions on the product label. Immediately after disinfecting, wash your hands with soap and water for 20 seconds. For completed guidance on cleaning and disinfecting your home, visit Complete Disinfection Guidance. Take steps to improve ventilation at home Improve ventilation (air flow) at home to help prevent from spreading COVID-19 to other people in your household. Clear out COVID-19 virus particles in the air by opening windows, using air filters, and turning on fans in your home. Use this interactive tool to learn how to improve air flow in your home. When you can be around others after being sick with COVID-19 Deciding when you can be around others is different for different situations. Find out when you can safely end home isolation. For any additional questions about your care, contact your healthcare provider or state or local health department. 04/01/2020 Content source: [facility] for Immunization and Respiratory Diseases ([facility]), Division of Viral Diseases This information is not intended to replace advice given to you by your health care provider. Make sure you discuss any questions you have with your health care provider. Document Revised: 05/15/2020 Document Reviewed: 05/15/2020 Elsevier Patient Education  2022 [employer].     If you have been instructed to have an in-person evaluation today at a local Urgent Care facility, please use the link below. It will take you to a list of all of our available [facility], including address, phone number and hours of operation. Please do not delay care.  [facility]  If you or a family member do not have a primary care provider, use  the link below to schedule a visit and establish care. When you choose a [facility] primary  care physician or advanced practice provider, you gain a long-term partner in health. Find a Primary Care Provider  Learn more about [facility]'s in-office and virtual care options: [facility] - Get Care Now

## 2022-09-03 LAB — HM MAMMOGRAPHY

## 2022-09-24 ENCOUNTER — Encounter: Payer: Self-pay | Admitting: Pharmacist

## 2022-09-28 ENCOUNTER — Ambulatory Visit (INDEPENDENT_AMBULATORY_CARE_PROVIDER_SITE_OTHER): Payer: BC Managed Care – PPO | Admitting: Family Medicine

## 2022-09-28 ENCOUNTER — Encounter: Payer: Self-pay | Admitting: Family Medicine

## 2022-09-28 VITALS — BP 142/94 | HR 59 | Temp 98.8°F | Ht 63.0 in | Wt 173.4 lb

## 2022-09-28 DIAGNOSIS — I201 Angina pectoris with documented spasm: Secondary | ICD-10-CM | POA: Diagnosis not present

## 2022-09-28 DIAGNOSIS — Z23 Encounter for immunization: Secondary | ICD-10-CM | POA: Diagnosis not present

## 2022-09-28 DIAGNOSIS — I10 Essential (primary) hypertension: Secondary | ICD-10-CM | POA: Diagnosis not present

## 2022-09-28 DIAGNOSIS — R232 Flushing: Secondary | ICD-10-CM

## 2022-09-28 DIAGNOSIS — Z1211 Encounter for screening for malignant neoplasm of colon: Secondary | ICD-10-CM

## 2022-09-28 LAB — FOLLICLE STIMULATING HORMONE: FSH: 80.4 m[IU]/mL

## 2022-09-28 MED ORDER — ATORVASTATIN CALCIUM 40 MG PO TABS: 40.0000 mg | ORAL_TABLET | Freq: Every day | ORAL | 1 refills | Status: DC

## 2022-09-28 MED ORDER — HYDROCHLOROTHIAZIDE 25 MG PO TABS: 25.0000 mg | ORAL_TABLET | Freq: Every day | ORAL | 1 refills | Status: DC

## 2022-09-28 NOTE — Assessment & Plan Note (Signed)
New symptom, this is significantly interfering with her sleep, worsening her level of fatigue. Will check [facility] to confirm her status and we discussed hormonal and nonhormonal treatment for menopausal hot flashes.

## 2022-09-28 NOTE — Assessment & Plan Note (Signed)
Continues to see her cardiologist every 6 months, recently had her imdur increased. Reviewed notes, will refill her atorvastatin. He attempted to start her on Wegovy for cardiac risk reduction however her insurance would not approve. Will attempt to re-order this medication in 6 months at her next visit.

## 2022-09-28 NOTE — Progress Notes (Signed)
Established Patient Office Visit  Subjective   Patient ID: Christine Kidd, female    DOB: 26-Feb-1970  Age: 52 y.o. MRN: [id number]  Chief Complaint  Patient presents with   Medical Management of Chronic Issues    Pt is here for follow up today. States that she has been checking her BP at home. States that they are slightly high -- 138-142 systolic at home. She does occasionally have mild discomfort in her chest but no pian.   Pt is continuing to have trouble with sleeping and excessive tiredness. States that she is maybe "getting used to it/" is waking up nightly with night sweats and hot flashes. Her PHQ score was reviewed, she reports that her sleep and tiredness are causing the increase. Pt had a hysterectomy to no longer has periods. We discussed getting an FSH to confirm menopause and she is agreeable to this.   HTN-- BP is elevated this morning in office which is consistent with per cardiology notes and her home readings. She denies any SOB, some mild swelling in her ankles when she gets off of work in the evening. We discussed increasing her hydrochlorothiazide and she is agreeable. I reviewed the notes from her cardiologist who had increased her imdur at the last visit.     Current Outpatient Medications  Medication Instructions   aspirin EC 81 mg, Oral, Daily   atorvastatin (LIPITOR) 40 mg, Oral, Daily   cetirizine (ZYRTEC) 10 mg, Oral, Daily PRN   ferrous sulfate 325 mg, Oral, 3 times daily with meals   hydrochlorothiazide (HYDRODIURIL) 25 mg, Oral, Daily   isosorbide mononitrate (IMDUR) 30 mg, Oral, 2 times daily   metoprolol tartrate (LOPRESSOR) 25 mg, Oral, Daily   ticagrelor (BRILINTA) 90 MG TABS tablet Oral   Wheat Dextrin (BENEFIBER DRINK MIX PO) Oral, 2 times daily PRN, Teaspoonful    Patient Active Problem List   Diagnosis Date Noted   Hot flashes 09/28/2022   Coronary vasospasm (HCC) 09/25/2021   Iron deficiency anemia 11/02/2017   B12 deficiency  11/02/2017   Vitamin D deficiency 11/02/2017   Environmental allergies 01/03/2017   Status post abdominal supracervical subtotal hysterectomy 07/06/2016   IBS (irritable bowel syndrome) 04/03/2013   GERD (gastroesophageal reflux disease) 01/16/2013   Essential hypertension, benign 11/20/2012      Review of Systems  All other systems reviewed and are negative.     Objective:     BP (!) 142/94 (BP Location: Right Arm, Patient Position: Sitting, Cuff Size: Normal)   Pulse (!) 59   Temp 98.8 F (37.1 C) (Oral)   Ht 5\' 3"  (1.6 m)   Wt 173 lb 6.4 oz (78.7 kg)   LMP 06/30/2016 Comment: postop hysterectomy  SpO2 99%   BMI 30.72 kg/m    Physical Exam Vitals reviewed.  Constitutional:      Appearance: Normal appearance. She is well-groomed and overweight.  Eyes:     Conjunctiva/sclera: Conjunctivae normal.  Neck:     Thyroid: No thyromegaly.  Cardiovascular:     Rate and Rhythm: Normal rate and regular rhythm.     Pulses: Normal pulses.     Heart sounds: S1 normal and S2 normal.  Pulmonary:     Effort: Pulmonary effort is normal.     Breath sounds: Normal breath sounds and air entry.  Abdominal:     General: Bowel sounds are normal.  Musculoskeletal:     Right lower leg: No edema.     Left lower leg: No edema.  Neurological:     Mental Status: She is alert and oriented to person, place, and time. Mental status is at baseline.     Gait: Gait is intact.  Psychiatric:        Mood and Affect: Mood and affect normal.        Speech: Speech normal.        Behavior: Behavior normal.        Judgment: Judgment normal.      No results found for any visits on 09/28/22.  Last metabolic panel Lab Results  Component Value Date   GLUCOSE 100 (H) 03/26/2022   NA 139 03/26/2022   K 4.5 03/26/2022   CL 100 03/26/2022   CO2 31 03/26/2022   BUN 13 03/26/2022   CREATININE 0.88 03/26/2022   GFR 74.85 03/26/2022   CALCIUM 9.9 03/26/2022   PROT 7.6 03/26/2022   ALBUMIN 4.5  03/26/2022   BILITOT 0.6 03/26/2022   ALKPHOS 55 03/26/2022   AST 27 03/26/2022   ALT 28 03/26/2022   ANIONGAP 4 (L) 07/07/2016   Last lipids Lab Results  Component Value Date   CHOL 158 03/26/2022   HDL 62.40 03/26/2022   LDLCALC 76 03/26/2022   TRIG 100.0 03/26/2022   CHOLHDL 3 03/26/2022      The ASCVD Risk score (Arnett DK, et al., 2019) failed to calculate for the following reasons:   The patient has a prior MI or stroke diagnosis    Assessment & Plan:  Essential hypertension, benign Assessment & Plan: BP chronic, peristent mild elevation, above goal. The increase in Imdur from the cardiologist did not change her BP much. I recommended increasing her hydrochlorothiazide to 25 mg daily and she is agreeable. Will call this in for her and she was counseled to continue to monitor her pressures at home. RTC in 6 months.   Orders: -     hydroCHLOROthiazide; Take 1 tablet (25 mg total) by mouth daily.  Dispense: 90 tablet; Refill: 1  Coronary vasospasm (HCC) Assessment & Plan: Continues to see her cardiologist every 6 months, recently had her imdur increased. Reviewed notes, will refill her atorvastatin. He attempted to start her on Wegovy for cardiac risk reduction however her insurance would not approve. Will attempt to re-order this medication in 6 months at her next visit.   Orders: -     Atorvastatin Calcium; Take 1 tablet (40 mg total) by mouth daily.  Dispense: 90 tablet; Refill: 1  Hot flashes Assessment & Plan: New symptom, this is significantly interfering with her sleep, worsening her level of fatigue. Will check FSH to confirm her status and we discussed hormonal and nonhormonal treatment for menopausal hot flashes.  Orders: -     Follicle stimulating hormone  Colon cancer screening -     Ambulatory referral to Gastroenterology  Need for immunization against influenza -     Flu vaccine trivalent PF, 6mos and older(Flulaval,Afluria,Fluarix,Fluzone)      Return in about 6 months (around 03/28/2023) for annual physical exam.    Karie Georges, MD

## 2022-09-28 NOTE — Assessment & Plan Note (Signed)
BP chronic, peristent mild elevation, above goal. The increase in Imdur from the cardiologist did not change her BP much. I recommended increasing her hydrochlorothiazide to 25 mg daily and she is agreeable. Will call this in for her and she was counseled to continue to monitor her pressures at home. RTC in 6 months.

## 2022-09-30 ENCOUNTER — Encounter: Payer: Self-pay | Admitting: Family Medicine

## 2022-09-30 DIAGNOSIS — N951 Menopausal and female climacteric states: Secondary | ICD-10-CM

## 2022-10-04 MED ORDER — PAROXETINE HCL 10 MG PO TABS: 10.0000 mg | ORAL_TABLET | Freq: Every day | ORAL | 0 refills | Status: DC

## 2022-11-27 ENCOUNTER — Encounter: Payer: Self-pay | Admitting: Family Medicine

## 2022-11-27 DIAGNOSIS — N951 Menopausal and female climacteric states: Secondary | ICD-10-CM

## 2022-11-30 MED ORDER — PAROXETINE HCL 20 MG PO TABS
20.0000 mg | ORAL_TABLET | Freq: Every day | ORAL | 0 refills | Status: DC
Start: 2022-11-30 — End: 2023-02-14

## 2023-01-10 ENCOUNTER — Other Ambulatory Visit: Payer: Self-pay | Admitting: Family Medicine

## 2023-01-10 DIAGNOSIS — N951 Menopausal and female climacteric states: Secondary | ICD-10-CM

## 2023-02-14 ENCOUNTER — Other Ambulatory Visit: Payer: Self-pay | Admitting: Family Medicine

## 2023-02-14 DIAGNOSIS — N951 Menopausal and female climacteric states: Secondary | ICD-10-CM

## 2023-03-28 ENCOUNTER — Other Ambulatory Visit: Payer: Self-pay | Admitting: Family Medicine

## 2023-03-28 ENCOUNTER — Ambulatory Visit: Payer: BC Managed Care – PPO | Admitting: Family Medicine

## 2023-03-28 DIAGNOSIS — I10 Essential (primary) hypertension: Secondary | ICD-10-CM

## 2023-04-04 ENCOUNTER — Other Ambulatory Visit: Payer: Self-pay | Admitting: Family Medicine

## 2023-04-04 DIAGNOSIS — I201 Angina pectoris with documented spasm: Secondary | ICD-10-CM

## 2023-04-08 ENCOUNTER — Ambulatory Visit: Admitting: Family Medicine

## 2023-04-08 ENCOUNTER — Encounter: Payer: Self-pay | Admitting: Family Medicine

## 2023-04-08 VITALS — BP 140/90 | HR 59 | Temp 97.9°F | Ht 63.0 in | Wt 175.9 lb

## 2023-04-08 DIAGNOSIS — M25541 Pain in joints of right hand: Secondary | ICD-10-CM

## 2023-04-08 DIAGNOSIS — I201 Angina pectoris with documented spasm: Secondary | ICD-10-CM | POA: Diagnosis not present

## 2023-04-08 DIAGNOSIS — E559 Vitamin D deficiency, unspecified: Secondary | ICD-10-CM

## 2023-04-08 DIAGNOSIS — D500 Iron deficiency anemia secondary to blood loss (chronic): Secondary | ICD-10-CM

## 2023-04-08 DIAGNOSIS — Z1211 Encounter for screening for malignant neoplasm of colon: Secondary | ICD-10-CM

## 2023-04-08 DIAGNOSIS — M25542 Pain in joints of left hand: Secondary | ICD-10-CM | POA: Diagnosis not present

## 2023-04-08 DIAGNOSIS — I10 Essential (primary) hypertension: Secondary | ICD-10-CM

## 2023-04-08 DIAGNOSIS — N951 Menopausal and female climacteric states: Secondary | ICD-10-CM

## 2023-04-08 LAB — LIPID PANEL
Cholesterol: 143 mg/dL (ref 0–200)
HDL: 44.9 mg/dL (ref >39.00)
LDL Cholesterol: 81 mg/dL (ref 0–99)
NonHDL: 98.32
Total CHOL/HDL Ratio: 3
Triglycerides: 85 mg/dL (ref 0.0–149.0)
VLDL: 17 mg/dL (ref 0.0–40.0)

## 2023-04-08 LAB — CBC WITH DIFFERENTIAL/PLATELET
Basophils Absolute: 0 10*3/uL (ref 0.0–0.1)
Basophils Relative: 0.9 % (ref 0.0–3.0)
Eosinophils Absolute: 0.1 10*3/uL (ref 0.0–0.7)
Eosinophils Relative: 3 % (ref 0.0–5.0)
HCT: 41.7 % (ref 36.0–46.0)
Hemoglobin: 14.1 g/dL (ref 12.0–15.0)
Lymphocytes Relative: 33.5 % (ref 12.0–46.0)
Lymphs Abs: 1.7 10*3/uL (ref 0.7–4.0)
MCHC: 33.9 g/dL (ref 30.0–36.0)
MCV: 89.1 fl (ref 78.0–100.0)
Monocytes Absolute: 0.3 10*3/uL (ref 0.1–1.0)
Monocytes Relative: 7 % (ref 3.0–12.0)
Neutro Abs: 2.8 10*3/uL (ref 1.4–7.7)
Neutrophils Relative %: 55.6 % (ref 43.0–77.0)
Platelets: 222 10*3/uL (ref 150.0–400.0)
RBC: 4.67 Mil/uL (ref 3.87–5.11)
RDW: 13 % (ref 11.5–15.5)
WBC: 5 10*3/uL (ref 4.0–10.5)

## 2023-04-08 LAB — COMPREHENSIVE METABOLIC PANEL WITH GFR
ALT: 20 U/L (ref 0–35)
AST: 22 U/L (ref 0–37)
Albumin: 4.6 g/dL (ref 3.5–5.2)
Alkaline Phosphatase: 60 U/L (ref 39–117)
BUN: 17 mg/dL (ref 6–23)
CO2: 33 meq/L — ABNORMAL HIGH (ref 19–32)
Calcium: 9.8 mg/dL (ref 8.4–10.5)
Chloride: 100 meq/L (ref 96–112)
Creatinine, Ser: 0.88 mg/dL (ref 0.40–1.20)
GFR: 74.31 mL/min (ref >60.00)
Glucose, Bld: 101 mg/dL — ABNORMAL HIGH (ref 70–99)
Potassium: 4.4 meq/L (ref 3.5–5.1)
Sodium: 139 meq/L (ref 135–145)
Total Bilirubin: 0.4 mg/dL (ref 0.2–1.2)
Total Protein: 7.7 g/dL (ref 6.0–8.3)

## 2023-04-08 LAB — VITAMIN D 25 HYDROXY (VIT D DEFICIENCY, FRACTURES): VITD: 32.56 ng/mL (ref 30.00–100.00)

## 2023-04-08 LAB — URIC ACID: Uric Acid, Serum: 5.1 mg/dL (ref 2.4–7.0)

## 2023-04-08 MED ORDER — TELMISARTAN 20 MG PO TABS: 20.0000 mg | ORAL_TABLET | Freq: Every day | ORAL | 1 refills | Status: DC

## 2023-04-08 MED ORDER — NITROGLYCERIN 0.4 MG SL SUBL: 0.4000 mg | SUBLINGUAL_TABLET | SUBLINGUAL | 3 refills | Status: AC | PRN

## 2023-04-08 MED ORDER — ATORVASTATIN CALCIUM 40 MG PO TABS
ORAL_TABLET | ORAL | 1 refills | Status: DC
Start: 2023-04-08 — End: 2023-10-10

## 2023-04-08 MED ORDER — PAROXETINE HCL 20 MG PO TABS
20.0000 mg | ORAL_TABLET | Freq: Every day | ORAL | 1 refills | Status: DC
Start: 2023-04-08 — End: 2023-10-10

## 2023-04-08 MED ORDER — HYDROCHLOROTHIAZIDE 25 MG PO TABS
25.0000 mg | ORAL_TABLET | Freq: Every day | ORAL | 1 refills | Status: DC
Start: 2023-04-08 — End: 2023-10-10

## 2023-04-08 NOTE — Progress Notes (Signed)
 Established Patient Office Visit  Subjective   Patient ID: Amilee Janvier, female    DOB: 01-11-1971  Age: 53 y.o. MRN: [id number]  Chief Complaint  Patient presents with   Medical Management of Chronic Issues    Pt is here for 6 month follow up today on HTN. She rpeorts she is getting about the same BP readings as before, the increase in medication has not improved her blood pressures. She denies any dizziness, SOB or headaches.   Coronary vasospasm - pt continues to report occasionalytwinge of chest pain here and there. Not associated with exercise. Is going to see her cardiologist soon -- she needs refills today on her medications.  Hot flashes -- pt reports they are relatively controlled on paroxetine, needs refills today  Pt is reporting BL chronic hand joint pain, she reports general achiness in her hands, no contractures noted, no family history of rheumatologic disorders.      Current Outpatient Medications  Medication Instructions   aspirin EC 81 mg, Daily   atorvastatin (LIPITOR) 40 MG tablet TAKE 1 TABLET(40 MG) BY MOUTH DAILY   cetirizine (ZYRTEC) 10 mg, Daily PRN   hydrochlorothiazide (HYDRODIURIL) 25 mg, Oral, Daily   isosorbide mononitrate (IMDUR) 30 mg, 2 times daily   metoprolol tartrate (LOPRESSOR) 25 mg, Daily   nitroGLYCERIN (NITROSTAT) 0.4 mg, Sublingual, Every 5 min PRN   PARoxetine (PAXIL) 20 mg, Oral, Daily   telmisartan (MICARDIS) 20 mg, Oral, Daily   Wheat Dextrin (BENEFIBER DRINK MIX PO) 2 times daily PRN    Patient Active Problem List   Diagnosis Date Noted   Hot flashes 09/28/2022   Coronary vasospasm (HCC) 09/25/2021   Iron deficiency anemia 11/02/2017   B12 deficiency 11/02/2017   Vitamin D deficiency 11/02/2017   Environmental allergies 01/03/2017   Status post abdominal supracervical subtotal hysterectomy 07/06/2016   IBS (irritable bowel syndrome) 04/03/2013   GERD (gastroesophageal reflux disease) 01/16/2013   Essential  hypertension, benign 11/20/2012      Review of Systems  All other systems reviewed and are negative.     Objective:     BP (!) 140/90   Pulse (!) 59   Temp 97.9 F (36.6 C) (Oral)   Ht 5\' 3"  (1.6 m)   Wt 175 lb 14.4 oz (79.8 kg)   LMP 06/30/2016 Comment: postop hysterectomy  SpO2 99%   BMI 31.16 kg/m    Physical Exam Vitals reviewed.  Constitutional:      Appearance: Normal appearance. She is well-groomed and normal weight.  Eyes:     Conjunctiva/sclera: Conjunctivae normal.  Cardiovascular:     Rate and Rhythm: Normal rate and regular rhythm.     Pulses: Normal pulses.     Heart sounds: S1 normal and S2 normal.  Pulmonary:     Effort: Pulmonary effort is normal.     Breath sounds: Normal breath sounds and air entry.  Musculoskeletal:     Right lower leg: No edema.     Left lower leg: No edema.  Neurological:     Mental Status: She is alert and oriented to person, place, and time. Mental status is at baseline.     Gait: Gait is intact.  Psychiatric:        Mood and Affect: Mood and affect normal.        Speech: Speech normal.        Behavior: Behavior normal.        Judgment: Judgment normal.  The ASCVD Risk score (Arnett DK, et al., 2019) failed to calculate for the following reasons:   Risk score cannot be calculated because patient has a medical history suggesting prior/existing ASCVD    Assessment & Plan:  Vitamin D deficiency Assessment & Plan: Needs follow up labs to ensure resolution  Orders: -     VITAMIN D 25 Hydroxy (Vit-D Deficiency, Fractures); Future  Coronary vasospasm (HCC) Assessment & Plan: I refilled her statin medication, I advised that she follow up with her cardiologist since she continues to have occasional twinges of pain in her chest. Continue imdur and metoprolol as prescribed by her cardiologist.   Orders: -     Atorvastatin Calcium; TAKE 1 TABLET(40 MG) BY MOUTH DAILY  Dispense: 90 tablet; Refill: 1 -     Lipid  panel; Future -     Nitroglycerin; Place 1 tablet (0.4 mg total) under the tongue every 5 (five) minutes as needed for chest pain.  Dispense: 50 tablet; Refill: 3  Essential hypertension, benign Assessment & Plan: Chronic, not well controlled, will add telmisartan 20 mg daily to her regimen and she will continue monitoring her BP at home. Checking annual CMP today  Orders: -     hydroCHLOROthiazide; Take 1 tablet (25 mg total) by mouth daily.  Dispense: 90 tablet; Refill: 1 -     Telmisartan; Take 1 tablet (20 mg total) by mouth daily.  Dispense: 90 tablet; Refill: 1 -     Comprehensive metabolic panel with GFR; Future  Hot flashes due to menopause -     PARoxetine HCl; Take 1 tablet (20 mg total) by mouth daily.  Dispense: 90 tablet; Refill: 1  Arthralgia of both hands Checking uric acid and rheumatoid factor to rule out inflammatory arthritis, recommended diclofenac gel 1% every 6 hours PRN hand pain  -     Rheumatoid factor; Future -     Uric acid; Future -     ANA w/Reflex; Future  Colon cancer screening -     Ambulatory referral to Gastroenterology  Iron deficiency anemia due to chronic blood loss Assessment & Plan: Needs follow up CBC to ensure resolution  Orders: -     CBC with Differential/Platelet; Future     Return in about 6 months (around 10/09/2023) for HTN, annual physical exam.    Karie Georges, MD

## 2023-04-08 NOTE — Patient Instructions (Signed)
 Diclofenac (voltaren) 1 % gel apply up to four time a day

## 2023-04-09 LAB — RHEUMATOID FACTOR: Rheumatoid fact SerPl-aCnc: <10 [IU]/mL (ref <14)

## 2023-04-10 LAB — ANA W/REFLEX: Anti Nuclear Antibody (ANA): NEGATIVE

## 2023-04-11 ENCOUNTER — Encounter: Payer: Self-pay | Admitting: Family Medicine

## 2023-04-11 NOTE — Assessment & Plan Note (Signed)
 Needs follow up CBC to ensure resolution

## 2023-04-11 NOTE — Assessment & Plan Note (Signed)
 Needs follow up labs to ensure resolution

## 2023-04-11 NOTE — Assessment & Plan Note (Signed)
 I refilled her statin medication, I advised that she follow up with her cardiologist since she continues to have occasional twinges of pain in her chest. Continue imdur and metoprolol as prescribed by her cardiologist.

## 2023-04-11 NOTE — Assessment & Plan Note (Signed)
 Chronic, not well controlled, will add telmisartan 20 mg daily to her regimen and she will continue monitoring her BP at home. Checking annual CMP today

## 2023-04-21 ENCOUNTER — Encounter: Payer: Self-pay | Admitting: Family Medicine

## 2023-06-02 ENCOUNTER — Encounter: Payer: Self-pay | Admitting: Family Medicine

## 2023-07-29 ENCOUNTER — Encounter: Payer: Self-pay | Admitting: Gastroenterology

## 2023-08-15 ENCOUNTER — Telehealth: Payer: Self-pay

## 2023-08-15 ENCOUNTER — Encounter

## 2023-08-15 NOTE — Telephone Encounter (Signed)
 No Answer. No call returned.  Did not reschedule pre visit.  No Show letter sent and colonoscopy cancelled.

## 2023-08-17 ENCOUNTER — Encounter: Payer: Self-pay | Admitting: Family Medicine

## 2023-08-31 ENCOUNTER — Encounter: Admitting: Gastroenterology

## 2023-10-04 ENCOUNTER — Other Ambulatory Visit: Payer: Self-pay | Admitting: Family Medicine

## 2023-10-04 DIAGNOSIS — I10 Essential (primary) hypertension: Secondary | ICD-10-CM

## 2023-10-10 ENCOUNTER — Ambulatory Visit: Admitting: Family Medicine

## 2023-10-10 ENCOUNTER — Encounter: Payer: Self-pay | Admitting: Family Medicine

## 2023-10-10 VITALS — BP 122/88 | HR 80 | Temp 98.6°F | Ht 62.75 in | Wt 190.9 lb

## 2023-10-10 DIAGNOSIS — N951 Menopausal and female climacteric states: Secondary | ICD-10-CM | POA: Diagnosis not present

## 2023-10-10 DIAGNOSIS — Z23 Encounter for immunization: Secondary | ICD-10-CM | POA: Diagnosis not present

## 2023-10-10 DIAGNOSIS — E538 Deficiency of other specified B group vitamins: Secondary | ICD-10-CM | POA: Diagnosis not present

## 2023-10-10 DIAGNOSIS — Z1231 Encounter for screening mammogram for malignant neoplasm of breast: Secondary | ICD-10-CM

## 2023-10-10 DIAGNOSIS — R739 Hyperglycemia, unspecified: Secondary | ICD-10-CM | POA: Diagnosis not present

## 2023-10-10 DIAGNOSIS — I10 Essential (primary) hypertension: Secondary | ICD-10-CM | POA: Diagnosis not present

## 2023-10-10 DIAGNOSIS — I201 Angina pectoris with documented spasm: Secondary | ICD-10-CM

## 2023-10-10 DIAGNOSIS — Z0000 Encounter for general adult medical examination without abnormal findings: Secondary | ICD-10-CM | POA: Diagnosis not present

## 2023-10-10 LAB — BASIC METABOLIC PANEL WITH GFR
BUN: 14 mg/dL (ref 6–23)
CO2: 31 meq/L (ref 19–32)
Calcium: 10.1 mg/dL (ref 8.4–10.5)
Chloride: 99 meq/L (ref 96–112)
Creatinine, Ser: 0.79 mg/dL (ref 0.40–1.20)
GFR: 84.28 mL/min (ref >60.00)
Glucose, Bld: 101 mg/dL — ABNORMAL HIGH (ref 70–99)
Potassium: 4.1 meq/L (ref 3.5–5.1)
Sodium: 138 meq/L (ref 135–145)

## 2023-10-10 LAB — HEMOGLOBIN A1C: Hgb A1c MFr Bld: 6.8 % — ABNORMAL HIGH (ref 4.6–6.5)

## 2023-10-10 LAB — VITAMIN B12: Vitamin B-12: 1343 pg/mL — ABNORMAL HIGH (ref 211–911)

## 2023-10-10 MED ORDER — TELMISARTAN 20 MG PO TABS: 20.0000 mg | ORAL_TABLET | Freq: Every day | ORAL | 1 refills | Status: AC

## 2023-10-10 MED ORDER — ATORVASTATIN CALCIUM 40 MG PO TABS: ORAL_TABLET | ORAL | 1 refills | Status: AC

## 2023-10-10 MED ORDER — METOPROLOL TARTRATE 25 MG PO TABS: 25.0000 mg | ORAL_TABLET | Freq: Every day | ORAL | 1 refills | Status: AC

## 2023-10-10 MED ORDER — ISOSORBIDE MONONITRATE ER 30 MG PO TB24: 60.0000 mg | ORAL_TABLET | Freq: Two times a day (BID) | ORAL | 1 refills | Status: AC

## 2023-10-10 MED ORDER — PAROXETINE HCL 20 MG PO TABS: 20.0000 mg | ORAL_TABLET | Freq: Every day | ORAL | 1 refills | Status: AC

## 2023-10-10 MED ORDER — HYDROCHLOROTHIAZIDE 25 MG PO TABS: 25.0000 mg | ORAL_TABLET | Freq: Every day | ORAL | 1 refills | Status: AC

## 2023-10-10 NOTE — Progress Notes (Signed)
 Complete physical exam  Patient: Christine Kidd   DOB: April 15, 1970   53 y.o. Female  MRN: [id number]  Subjective:    Chief Complaint  Patient presents with   Annual Exam    Christine Kidd is a 53 y.o. female who presents today for a complete physical exam. She reports consuming a general diet. Eats mostly meat free, uses alternative meats as the protein source, eating fruits and veggies daily, usually eats fast food on weekends only. The patient does not participate in regular exercise at present. She generally feels well. She reports sleeping somewhat poorly, state shas back pain and hip stiffness at night which is waking her up. She does not have additional problems to discuss today.    Most recent fall risk assessment:    03/26/2022    8:07 AM  Fall Risk   Falls in the past year? 0  Number falls in past yr: 0  Injury with Fall? 0  Risk for fall due to : No Fall Risks     Most recent depression screenings:    10/10/2023    8:29 AM 04/08/2023    9:50 AM  PHQ 2/9 Scores  PHQ - 2 Score 1 0  PHQ- 9 Score 2 1    Vision:Within last year and Dental: No current dental problems and Receives regular dental care  Patient Active Problem List   Diagnosis Date Noted   Hot flashes 09/28/2022   Coronary vasospasm 09/25/2021   Iron deficiency anemia 11/02/2017   B12 deficiency 11/02/2017   Vitamin D  deficiency 11/02/2017   Environmental allergies 01/03/2017   Status post abdominal supracervical subtotal hysterectomy 07/06/2016   IBS (irritable bowel syndrome) 04/03/2013   GERD (gastroesophageal reflux disease) 01/16/2013   Essential hypertension, benign 11/20/2012      Patient Care Team: Ozell Heron HERO, MD as PCP - General (Family Medicine) Edsel Norleen GAILS, MD (Inactive) as Consulting Physician (Obstetrics and Gynecology)   Outpatient Medications Prior to Visit  Medication Sig   aspirin EC 81 MG tablet Take 81 mg by mouth daily.   cetirizine (ZYRTEC)  10 MG tablet Take 10 mg by mouth daily as needed for allergies.   nitroGLYCERIN  (NITROSTAT ) 0.4 MG SL tablet Place 1 tablet (0.4 mg total) under the tongue every 5 (five) minutes as needed for chest pain.   Wheat Dextrin (BENEFIBER DRINK MIX PO) Take by mouth 2 (two) times daily as needed (constipated). Teaspoonful   [DISCONTINUED] atorvastatin  (LIPITOR) 40 MG tablet TAKE 1 TABLET(40 MG) BY MOUTH DAILY   [DISCONTINUED] hydrochlorothiazide  (HYDRODIURIL ) 25 MG tablet Take 1 tablet (25 mg total) by mouth daily.   [DISCONTINUED] isosorbide mononitrate (IMDUR) 30 MG 24 hr tablet Take 30 mg by mouth in the morning and at bedtime. (Patient taking differently: Take 60 mg by mouth in the morning and at bedtime.)   [DISCONTINUED] metoprolol tartrate (LOPRESSOR) 25 MG tablet Take 25 mg by mouth daily.   [DISCONTINUED] PARoxetine  (PAXIL ) 20 MG tablet Take 1 tablet (20 mg total) by mouth daily.   [DISCONTINUED] telmisartan  (MICARDIS ) 20 MG tablet TAKE 1 TABLET BY MOUTH EVERY DAY   No facility-administered medications prior to visit.    Review of Systems  HENT:  Negative for hearing loss.   Eyes:  Negative for blurred vision.  Respiratory:  Negative for shortness of breath.   Cardiovascular:  Negative for chest pain.  Gastrointestinal: Negative.   Genitourinary: Negative.   Musculoskeletal:  Negative for back pain.  Neurological:  Negative for headaches.  Psychiatric/Behavioral:  Negative for depression.        Objective:     BP 122/88   Pulse 80   Temp 98.6 F (37 C) (Oral)   Ht 5' 2.75 (1.594 m)   Wt 190 lb 14.4 oz (86.6 kg)   LMP 06/30/2016 Comment: postop hysterectomy  SpO2 98%   BMI 34.09 kg/m    Physical Exam Vitals reviewed.  Constitutional:      Appearance: Normal appearance. She is well-groomed. She is obese.  HENT:     Right Ear: Tympanic membrane and ear canal normal.     Left Ear: Tympanic membrane and ear canal normal.     Mouth/Throat:     Mouth: Mucous membranes are  moist.     Pharynx: No posterior oropharyngeal erythema.  Eyes:     Conjunctiva/sclera: Conjunctivae normal.  Neck:     Thyroid : No thyromegaly.  Cardiovascular:     Rate and Rhythm: Normal rate and regular rhythm.     Pulses: Normal pulses.     Heart sounds: S1 normal and S2 normal.  Pulmonary:     Effort: Pulmonary effort is normal.     Breath sounds: Normal breath sounds and air entry.  Abdominal:     General: Abdomen is flat. Bowel sounds are normal.     Palpations: Abdomen is soft.  Musculoskeletal:     Right lower leg: No edema.     Left lower leg: No edema.  Lymphadenopathy:     Cervical: No cervical adenopathy.  Neurological:     Mental Status: She is alert and oriented to person, place, and time. Mental status is at baseline.     Gait: Gait is intact.  Psychiatric:        Mood and Affect: Mood and affect normal.        Speech: Speech normal.        Behavior: Behavior normal.        Judgment: Judgment normal.      No results found for any visits on 10/10/23.     Assessment & Plan:    Routine Health Maintenance and Physical Exam  Immunization History  Administered Date(s) Administered   Hepatitis B, ADULT 11/22/2018, 01/22/2019   Influenza, Seasonal, Injecte, Preservative Fre 09/28/2022   Influenza,inj,Quad PF,6+ Mos 11/02/2017, 11/01/2018, 09/25/2021   Influenza-Unspecified 10/25/2016   Moderna Sars-Covid-2 Vaccination 10/12/2019   PFIZER(Purple Top)SARS-COV-2 Vaccination 03/18/2019, 04/08/2019   Tdap 11/02/2017   Zoster Recombinant(Shingrix ) 06/25/2020, 03/26/2022    Health Maintenance  Topic Date Due   Pneumococcal Vaccine: 50+ Years (1 of 2 - PCV) Never done   Hepatitis B Vaccines 19-59 Average Risk (3 of 3 - 19+ 3-dose series) 05/22/2019   Colonoscopy  12/01/2022   Influenza Vaccine  08/12/2023   COVID-19 Vaccine (5 - 2025-26 season) 09/12/2023   Mammogram  09/02/2024   Cervical Cancer Screening (HPV/Pap Cotest)  06/25/2025   DTaP/Tdap/Td (2 -  Td or Tdap) 11/03/2027   Hepatitis C Screening  Completed   HIV Screening  Completed   Zoster Vaccines- Shingrix   Completed   HPV VACCINES  Aged Out   Meningococcal B Vaccine  Aged Out    Discussed health benefits of physical activity, and encouraged her to engage in regular exercise appropriate for her age and condition.  Routine general medical examination at a health care facility  Coronary vasospasm -     Atorvastatin  Calcium ; TAKE 1 TABLET(40 MG) BY MOUTH DAILY  Dispense: 90 tablet; Refill: 1 -  Isosorbide Mononitrate ER; Take 2 tablets (60 mg total) by mouth in the morning and at bedtime.  Dispense: 240 tablet; Refill: 1 -     Metoprolol Tartrate; Take 1 tablet (25 mg total) by mouth daily.  Dispense: 90 tablet; Refill: 1  Essential hypertension, benign -     hydroCHLOROthiazide ; Take 1 tablet (25 mg total) by mouth daily.  Dispense: 90 tablet; Refill: 1 -     Telmisartan ; Take 1 tablet (20 mg total) by mouth daily.  Dispense: 90 tablet; Refill: 1  Hot flashes due to menopause -     PARoxetine  HCl; Take 1 tablet (20 mg total) by mouth daily.  Dispense: 90 tablet; Refill: 1  Breast cancer screening by mammogram -     3D Screening Mammogram, Left and Right; Future  Immunization due -     Flu vaccine trivalent PF, 6mos and older(Flulaval,Afluria,Fluarix,Fluzone)  B12 deficiency -     Vitamin B12; Future  Hyperglycemia -     Hemoglobin A1c; Future  General physical exam findings are normal today. I reviewed the patient's preventative testing, immunizations, and lifestyle habits. I made appropriate recommendations and placed orders for the appropriate tests and/or vaccinations. I counseled the patient on the CDC's recommendations for healthy exercise and diet. I counseled the patient on healthy sleep habits and stress management. Handouts to reinforce the counseling were given at the conclusion of the visit.    Return in 6 months (on 04/08/2024).     Heron CHRISTELLA Sharper,  MD

## 2023-10-10 NOTE — Patient Instructions (Addendum)
 [phone] , select option 1  to schedule colonoscopy. Health Maintenance, Female Adopting a healthy lifestyle and getting preventive care are important in promoting health and wellness. Ask your health care provider about: The right schedule for you to have regular tests and exams. Things you can do on your own to prevent diseases and keep yourself healthy. What should I know about diet, weight, and exercise? Eat a healthy diet  Eat a diet that includes plenty of vegetables, fruits, low-fat dairy products, and lean protein. Do not eat a lot of foods that are high in solid fats, added sugars, or sodium. Maintain a healthy weight Body mass index (BMI) is used to identify weight problems. It estimates body fat based on height and weight. Your health care provider can help determine your BMI and help you achieve or maintain a healthy weight. Get regular exercise Get regular exercise. This is one of the most important things you can do for your health. Most adults should: Exercise for at least 150 minutes each week. The exercise should increase your heart rate and make you sweat (moderate-intensity exercise). Do strengthening exercises at least twice a week. This is in addition to the moderate-intensity exercise. Spend less time sitting. Even light physical activity can be beneficial. Watch cholesterol and blood lipids Have your blood tested for lipids and cholesterol at 55 years of age, then have this test every 5 years. Have your cholesterol levels checked more often if: Your lipid or cholesterol levels are high. You are older than 55 years of age. You are at high risk for heart disease. What should I know about cancer screening? Depending on your health history and family history, you may need to have cancer screening at various ages. This may include screening for: Breast cancer. Cervical cancer. Colorectal cancer. Skin cancer. Lung cancer. What should I know about heart disease,  diabetes, and high blood pressure? Blood pressure and heart disease High blood pressure causes heart disease and increases the risk of stroke. This is more likely to develop in people who have high blood pressure readings or are overweight. Have your blood pressure checked: Every 3-5 years if you are 2-91 years of age. Every year if you are 62 years old or older. Diabetes Have regular diabetes screenings. This checks your fasting blood sugar level. Have the screening done: Once every three years after age 44 if you are at a normal weight and have a low risk for diabetes. More often and at a younger age if you are overweight or have a high risk for diabetes. What should I know about preventing infection? Hepatitis B If you have a higher risk for hepatitis B, you should be screened for this virus. Talk with your health care provider to find out if you are at risk for hepatitis B infection. Hepatitis C Testing is recommended for: Everyone born from [birth year] through 1967. Anyone with known risk factors for hepatitis C. Sexually transmitted infections (STIs) Get screened for STIs, including gonorrhea and chlamydia, if: You are sexually active and are younger than 55 years of age. You are older than 55 years of age and your health care provider tells you that you are at risk for this type of infection. Your sexual activity has changed since you were last screened, and you are at increased risk for chlamydia or gonorrhea. Ask your health care provider if you are at risk. Ask your health care provider about whether you are at high risk for HIV. Your health care provider  may recommend a prescription medicine to help prevent HIV infection. If you choose to take medicine to prevent HIV, you should first get tested for HIV. You should then be tested every 3 months for as long as you are taking the medicine. Pregnancy If you are about to stop having your period (premenopausal) and you may become pregnant,  seek counseling before you get pregnant. Take 400 to 800 micrograms (mcg) of folic acid  every day if you become pregnant. Ask for birth control (contraception) if you want to prevent pregnancy. Osteoporosis and menopause Osteoporosis is a disease in which the bones lose minerals and strength with aging. This can result in bone fractures. If you are 59 years old or older, or if you are at risk for osteoporosis and fractures, ask your health care provider if you should: Be screened for bone loss. Take a calcium  or vitamin D  supplement to lower your risk of fractures. Be given hormone replacement therapy (HRT) to treat symptoms of menopause. Follow these instructions at home: Alcohol use Do not drink alcohol if: Your health care provider tells you not to drink. You are pregnant, may be pregnant, or are planning to become pregnant. If you drink alcohol: Limit how much you have to: 0-1 drink a day. Know how much alcohol is in your drink. In the [place] one drink equals one 12 oz bottle of beer (355 mL), one 5 oz glass of wine (148 mL), or one 1 oz glass of hard liquor (44 mL). Lifestyle Do not use any products that contain nicotine or tobacco. These products include cigarettes, chewing tobacco, and vaping devices, such as e-cigarettes. If you need help quitting, ask your health care provider. Do not use street drugs. Do not share needles. Ask your health care provider for help if you need support or information about quitting drugs. General instructions Schedule regular health, dental, and eye exams. Stay current with your vaccines. Tell your health care provider if: You often feel depressed. You have ever been abused or do not feel safe at home. Summary Adopting a healthy lifestyle and getting preventive care are important in promoting health and wellness. Follow your health care provider's instructions about healthy diet, exercising, and getting tested or screened for diseases. Follow your  health care provider's instructions on monitoring your cholesterol and blood pressure. This information is not intended to replace advice given to you by your health care provider. Make sure you discuss any questions you have with your health care provider. Document Revised: 05/19/2020 Document Reviewed: 05/19/2020 [employer] Patient Education  2024 [employer].

## 2023-10-11 ENCOUNTER — Ambulatory Visit: Payer: Self-pay | Admitting: Family Medicine

## 2023-10-26 ENCOUNTER — Encounter (INDEPENDENT_AMBULATORY_CARE_PROVIDER_SITE_OTHER): Payer: Self-pay | Admitting: Gastroenterology

## 2024-04-06 ENCOUNTER — Ambulatory Visit: Admitting: Family Medicine
